# Patient Record
Sex: Female | Born: 1950
Health system: Southern US, Community
[De-identification: ages and names within clinical notes are randomized; demographics above are authoritative.]

## PROBLEM LIST (undated history)

## (undated) DIAGNOSIS — E785 Hyperlipidemia, unspecified: Secondary | ICD-10-CM

## (undated) DIAGNOSIS — H3530 Unspecified macular degeneration: Secondary | ICD-10-CM

## (undated) DIAGNOSIS — I10 Essential (primary) hypertension: Secondary | ICD-10-CM

## (undated) DIAGNOSIS — E119 Type 2 diabetes mellitus without complications: Secondary | ICD-10-CM

## (undated) DIAGNOSIS — K589 Irritable bowel syndrome without diarrhea: Secondary | ICD-10-CM

## (undated) DIAGNOSIS — H401130 Primary open-angle glaucoma, bilateral, stage unspecified: Secondary | ICD-10-CM

## (undated) HISTORY — DX: Type 2 diabetes mellitus without complications: E11.9

## (undated) HISTORY — PX: ROTATOR CUFF REPAIR: SHX139

## (undated) HISTORY — DX: Unspecified macular degeneration: H35.30

## (undated) HISTORY — DX: Hyperlipidemia, unspecified: E78.5

## (undated) HISTORY — DX: Irritable bowel syndrome, unspecified: K58.9

## (undated) HISTORY — DX: Essential (primary) hypertension: I10

## (undated) HISTORY — DX: Primary open-angle glaucoma, bilateral, stage unspecified: H40.1130

## (undated) HISTORY — PX: TUBAL LIGATION: SHX77

---

## 1998-04-27 ENCOUNTER — Emergency Department (HOSPITAL_COMMUNITY): Admission: EM | Admit: 1998-04-27 | Discharge: 1998-04-27 | Payer: Self-pay | Admitting: Emergency Medicine

## 1999-06-01 ENCOUNTER — Other Ambulatory Visit: Admission: RE | Admit: 1999-06-01 | Discharge: 1999-06-01 | Payer: Self-pay | Admitting: Obstetrics and Gynecology

## 1999-08-31 ENCOUNTER — Other Ambulatory Visit: Admission: RE | Admit: 1999-08-31 | Discharge: 1999-08-31 | Payer: Self-pay | Admitting: Obstetrics and Gynecology

## 2001-02-21 ENCOUNTER — Other Ambulatory Visit: Admission: RE | Admit: 2001-02-21 | Discharge: 2001-02-21 | Payer: Self-pay | Admitting: Obstetrics and Gynecology

## 2002-07-30 ENCOUNTER — Other Ambulatory Visit: Admission: RE | Admit: 2002-07-30 | Discharge: 2002-07-30 | Payer: Self-pay | Admitting: Obstetrics and Gynecology

## 2004-10-29 ENCOUNTER — Emergency Department (HOSPITAL_COMMUNITY): Admission: EM | Admit: 2004-10-29 | Discharge: 2004-10-29 | Payer: Self-pay | Admitting: Family Medicine

## 2013-05-20 ENCOUNTER — Encounter (HOSPITAL_COMMUNITY): Payer: Self-pay

## 2013-05-20 ENCOUNTER — Emergency Department (HOSPITAL_COMMUNITY)
Admission: EM | Admit: 2013-05-20 | Discharge: 2013-05-20 | Disposition: A | Discharge status: Home / Self Care | Payer: PRIVATE HEALTH INSURANCE | Attending: Emergency Medicine | Admitting: Emergency Medicine

## 2013-05-20 DIAGNOSIS — R197 Diarrhea, unspecified: Secondary | ICD-10-CM | POA: Insufficient documentation

## 2013-05-20 DIAGNOSIS — K2970 Gastritis, unspecified, without bleeding: Secondary | ICD-10-CM | POA: Insufficient documentation

## 2013-05-20 DIAGNOSIS — Z87891 Personal history of nicotine dependence: Secondary | ICD-10-CM | POA: Insufficient documentation

## 2013-05-20 DIAGNOSIS — R42 Dizziness and giddiness: Secondary | ICD-10-CM | POA: Insufficient documentation

## 2013-05-20 DIAGNOSIS — R112 Nausea with vomiting, unspecified: Secondary | ICD-10-CM | POA: Insufficient documentation

## 2013-05-20 LAB — URINALYSIS, ROUTINE W REFLEX MICROSCOPIC
Bilirubin Urine: NEGATIVE
Glucose, UA: NEGATIVE mg/dL
Hgb urine dipstick: NEGATIVE
Ketones, ur: NEGATIVE mg/dL
Leukocytes, UA: NEGATIVE
Nitrite: NEGATIVE
Protein, ur: NEGATIVE mg/dL
Specific Gravity, Urine: 1.014 (ref 1.005–1.030)
Urobilinogen, UA: 0.2 mg/dL (ref 0.0–1.0)
pH: 7 (ref 5.0–8.0)

## 2013-05-20 LAB — COMPREHENSIVE METABOLIC PANEL
ALT: 14 U/L (ref 0–35)
AST: 20 U/L (ref 0–37)
Albumin: 3.4 g/dL — ABNORMAL LOW (ref 3.5–5.2)
Alkaline Phosphatase: 84 U/L (ref 39–117)
BUN: 13 mg/dL (ref 6–23)
CO2: 27 mEq/L (ref 19–32)
Calcium: 8.9 mg/dL (ref 8.4–10.5)
Chloride: 104 mEq/L (ref 96–112)
Creatinine, Ser: 0.66 mg/dL (ref 0.50–1.10)
GFR calc Af Amer: >90 mL/min (ref >90)
GFR calc non Af Amer: >90 mL/min (ref >90)
Glucose, Bld: 113 mg/dL — ABNORMAL HIGH (ref 70–99)
Potassium: 4.4 mEq/L (ref 3.5–5.1)
Sodium: 139 mEq/L (ref 135–145)
Total Bilirubin: 0.2 mg/dL — ABNORMAL LOW (ref 0.3–1.2)
Total Protein: 7.5 g/dL (ref 6.0–8.3)

## 2013-05-20 LAB — CBC WITH DIFFERENTIAL/PLATELET
Basophils Absolute: 0 10*3/uL (ref 0.0–0.1)
Basophils Relative: 1 % (ref 0–1)
Eosinophils Absolute: 0.1 10*3/uL (ref 0.0–0.7)
Eosinophils Relative: 1 % (ref 0–5)
HCT: 35.2 % — ABNORMAL LOW (ref 36.0–46.0)
Hemoglobin: 12 g/dL (ref 12.0–15.0)
Lymphocytes Relative: 26 % (ref 12–46)
Lymphs Abs: 2 10*3/uL (ref 0.7–4.0)
MCH: 27.8 pg (ref 26.0–34.0)
MCHC: 34.1 g/dL (ref 30.0–36.0)
MCV: 81.7 fL (ref 78.0–100.0)
Monocytes Absolute: 0.8 10*3/uL (ref 0.1–1.0)
Monocytes Relative: 10 % (ref 3–12)
Neutro Abs: 5 10*3/uL (ref 1.7–7.7)
Neutrophils Relative %: 63 % (ref 43–77)
Platelets: 200 10*3/uL (ref 150–400)
RBC: 4.31 MIL/uL (ref 3.87–5.11)
RDW: 14.1 % (ref 11.5–15.5)
WBC: 7.9 10*3/uL (ref 4.0–10.5)

## 2013-05-20 LAB — LIPASE, BLOOD: Lipase: 22 U/L (ref 11–59)

## 2013-05-20 LAB — LACTIC ACID, PLASMA: Lactic Acid, Venous: 1.5 mmol/L (ref 0.5–2.2)

## 2013-05-20 MED ORDER — ONDANSETRON HCL 4 MG/2ML IJ SOLN: 4.0000 mg | Freq: Once | INTRAMUSCULAR | Status: DC

## 2013-05-20 MED ORDER — ONDANSETRON HCL 4 MG PO TABS: 4.0000 mg | ORAL_TABLET | Freq: Four times a day (QID) | ORAL | Status: DC

## 2013-05-20 MED ORDER — ONDANSETRON HCL 4 MG/2ML IJ SOLN
4.0000 mg | Freq: Once | INTRAMUSCULAR | Status: DC
  Filled 2013-05-20: qty 2

## 2013-05-20 MED ORDER — SODIUM CHLORIDE 0.9 % IV SOLN
1000.0000 mL | Freq: Once | INTRAVENOUS | Status: AC
  Administered 2013-05-20: 1000 mL via INTRAVENOUS

## 2013-05-20 NOTE — ED Notes (Signed)
Pt ambulated without difficulty

## 2013-05-20 NOTE — ED Provider Notes (Signed)
CSN: [id number]     Arrival date & time 05/20/13  0508 History   First MD Initiated Contact with Patient 05/20/13 [phone]     Chief Complaint  Patient presents with  . Nausea  . Emesis    unknown amounts more than 10  . Diarrhea    unknown amounts more than 10   (Consider location/radiation/quality/duration/timing/severity/associated sxs/prior Treatment) The history is provided by the patient. No language interpreter was used.  Bianca Foster is a 62 y/o F presenting to the ED, BIB EMS, with nausea, emesis, and diarrhea that started at approximately 3:00AM this morning. Patient reported that this was a sudden onset. Patient reported that while she was sleeping she woke up due to dizziness. Reported that she was continuously vomiting, reported at least more than 10 times. Patient reported that when vomiting she had episodes of diarrhea simultaneously. Reported that she ate a grilled burger at 9:00PM the night before. Denied falling, fainting, abdominal pain, chest pain, shortness of breath, difficulty breathing, blurred vision, sudden loss of vision, back pain, neck pain, chills, tremors, urinary symptoms. PCP none   History reviewed. No pertinent past medical history. History reviewed. No pertinent past surgical history. No family history on file. History  Substance Use Topics  . Smoking status: Former Smoker    Types: Cigarettes    Quit date: 05/20/2009  . Smokeless tobacco: Never Used  . Alcohol Use: 0.6 oz/week    1 Glasses of wine per week   OB History   Grav Para Term Preterm Abortions TAB SAB Ect Mult Living                 Review of Systems  Constitutional: Negative for fever and chills.  HENT: Negative for trouble swallowing and neck pain.   Eyes: Negative for visual disturbance.  Respiratory: Negative for chest tightness and shortness of breath.   Cardiovascular: Negative for chest pain.  Gastrointestinal: Positive for nausea, vomiting and diarrhea. Negative for abdominal  pain, constipation, blood in stool and anal bleeding.  Musculoskeletal: Negative for back pain.  Neurological: Positive for dizziness. Negative for numbness.  All other systems reviewed and are negative.    Allergies  Codeine  Home Medications   Current Outpatient Rx  Name  Route  Sig  Dispense  Refill  . cetirizine (ZYRTEC) 10 MG tablet   Oral   Take 10 mg by mouth daily as needed for allergies.         Marland Kitchen ondansetron (ZOFRAN) 4 MG tablet   Oral   Take 1 tablet (4 mg total) by mouth every 6 (six) hours.   12 tablet   0    BP 159/92  Pulse 79  Temp(Src) 97.6 F (36.4 C) (Oral)  Resp 18  SpO2 100% Physical Exam  Nursing note and vitals reviewed. Constitutional: She is oriented to person, place, and time. She appears well-developed and well-nourished. No distress.  HENT:  Head: Normocephalic and atraumatic.  Mouth/Throat: Oropharynx is clear and moist. No oropharyngeal exudate.  Negative posterior oropharynx swelling. Negative erythema, exudate, tonsilar adenopathy.  Eyes: Conjunctivae and EOM are normal. Pupils are equal, round, and reactive to light. Right eye exhibits no discharge. Left eye exhibits no discharge.  Negative nystagmus  Neck: Normal range of motion. Neck supple. No tracheal deviation present.  Negative neck stiffness Negative nuchal rigidity Negative pain upon palpation to the cervical spine   Cardiovascular: Normal rate, regular rhythm and normal heart sounds.  Exam reveals no friction rub.  No murmur heard. Pulses:      Radial pulses are 2+ on the right side, and 2+ on the left side.       Dorsalis pedis pulses are 2+ on the right side, and 2+ on the left side.  Pulmonary/Chest: Effort normal and breath sounds normal. No respiratory distress. She has no wheezes. She has no rales.  Abdominal: Soft. Bowel sounds are normal. She exhibits no distension. There is no tenderness. There is no rebound and no guarding.  Negative Murphy's sign Negative  McBurney's point  Musculoskeletal: Normal range of motion.  Lymphadenopathy:    She has no cervical adenopathy.  Neurological: She is alert and oriented to person, place, and time. No cranial nerve deficit. She exhibits normal muscle tone. Coordination normal.  Cranial nerves III-XII grossly intact   Skin: Skin is warm and dry. No rash noted. She is not diaphoretic. No erythema.  Psychiatric: She has a normal mood and affect. Her behavior is normal. Thought content normal.    ED Course  Procedures (including critical care time) Labs Review Labs Reviewed  COMPREHENSIVE METABOLIC PANEL - Abnormal; Notable for the following:    Glucose, Bld 113 (*)    Albumin 3.4 (*)    Total Bilirubin 0.2 (*)    All other components within normal limits  CBC WITH DIFFERENTIAL - Abnormal; Notable for the following:    HCT 35.2 (*)    All other components within normal limits  URINALYSIS, ROUTINE W REFLEX MICROSCOPIC  LIPASE, BLOOD  LACTIC ACID, PLASMA   Imaging Review No results found.  MDM   1. Gastritis   2. Nausea and vomiting     Patient presenting to the ED with sudden onset of nausea, vomiting, and diarrhea this morning with associated dizziness. Patient reported that she went out to eat last night and ate a burger at a restaurant at approximately 9:00PM. Alert and oriented. Negative acute abdomen, negative peritoneal signs. BS normoactive in all 4 quadrants. Soft, non-tender.  Lactic acid negative elevation. Lipase negative elevation. CMP negative findings. CBC negative findings. Urine negative for infection. Patient placed on IV fluids and Zofran administered. Upon assessment patient reported the nausea and dizziness have subsided. Patient tolerated liquid by mouth. No episodes of emesis while in the emergency department setting. Patient ambulated with-negative sway. Denied dizziness, headaches. Suspicion to be due to food, possible food poisoning. Patient reported that she feels a lot  better. Patient stable, afebrile. Discharged patient. Discharged patient with Zofran for nausea control. Discussed with patient bright-eyed for diarrhea control. Discussed with patient to rest and stay hydrated. Discussed the patient to [facility] to be reevaluated next week. Discussed with patient to continue monitor symptoms if symptoms are to worsen or change report back to emergency department-strict return instructions given. Patient agreed to plan of care, understood, all questions answered.  Raymon Mutton, PA-C 05/20/13 1658

## 2013-05-20 NOTE — ED Notes (Signed)
Brought in by [employer], patient ate [place] at a outdoor [employer], had hamburger and salad, EMS gave 4mg  zofran in transit, Nausea accompanied with vomiting and diarrhea at the same time, per son over 10 times, no blood in vomit or stool

## 2013-05-20 NOTE — ED Notes (Signed)
Night shift RN reports he gave pt her 0545 dose of Zofran at 0700.

## 2013-05-20 NOTE — Progress Notes (Signed)
Met patient at bedside.Role of case manager explained.Patient verbalizes understanding.Patient reports she does not have a PCP.Resource sheet provided for the [facility].Patient will call and make a PCP appointment next week.

## 2013-05-21 NOTE — ED Provider Notes (Signed)
Medical screening examination/treatment/procedure(s) were performed by non-physician practitioner and as supervising physician I was immediately available for consultation/collaboration.  Martrice Apt, MD 05/21/13 0440 

## 2013-05-29 ENCOUNTER — Encounter: Payer: Self-pay | Admitting: Internal Medicine

## 2013-05-29 ENCOUNTER — Ambulatory Visit: Payer: PRIVATE HEALTH INSURANCE | Attending: Internal Medicine | Admitting: Internal Medicine

## 2013-05-29 VITALS — BP 174/112 | HR 100 | Temp 97.4°F | Resp 16 | Ht 62.6 in | Wt 160.0 lb

## 2013-05-29 DIAGNOSIS — I10 Essential (primary) hypertension: Secondary | ICD-10-CM | POA: Insufficient documentation

## 2013-05-29 NOTE — Progress Notes (Signed)
Pt is here today to establish care. HFU   Last Saturday pt got food poisoning and received care at the hospital. She still feels weak and is weary of eating but reports that's she's healthy and doing much better.

## 2013-05-29 NOTE — Patient Instructions (Signed)
Hypertension  As your heart beats, it forces blood through your arteries. This force is your blood pressure. If the pressure is too high, it is called hypertension (HTN) or high blood pressure. HTN is dangerous because you may have it and not know it. High blood pressure may mean that your heart has to work harder to pump blood. Your arteries may be narrow or stiff. The extra work puts you at risk for heart disease, stroke, and other problems.   Blood pressure consists of two numbers, a higher number over a lower, 110/72, for example. It is stated as "110 over 72." The ideal is below 120 for the top number (systolic) and under 80 for the bottom (diastolic). Write down your blood pressure today.  You should pay close attention to your blood pressure if you have certain conditions such as:   Heart failure.   Prior heart attack.   Diabetes   Chronic kidney disease.   Prior stroke.   Multiple risk factors for heart disease.  To see if you have HTN, your blood pressure should be measured while you are seated with your arm held at the level of the heart. It should be measured at least twice. A one-time elevated blood pressure reading (especially in the Emergency Department) does not mean that you need treatment. There may be conditions in which the blood pressure is different between your right and left arms. It is important to see your caregiver soon for a recheck.  Most people have essential hypertension which means that there is not a specific cause. This type of high blood pressure may be lowered by changing lifestyle factors such as:   Stress.   Smoking.   Lack of exercise.   Excessive weight.   Drug/tobacco/alcohol use.   Eating less salt.  Most people do not have symptoms from high blood pressure until it has caused damage to the body. Effective treatment can often prevent, delay or reduce that damage.  TREATMENT    When a cause has been identified, treatment for high blood pressure is directed at the cause. There are a large number of medications to treat HTN. These fall into several categories, and your caregiver will help you select the medicines that are best for you. Medications may have side effects. You should review side effects with your caregiver.  If your blood pressure stays high after you have made lifestyle changes or started on medicines,    Your medication(s) may need to be changed.   Other problems may need to be addressed.   Be certain you understand your prescriptions, and know how and when to take your medicine.   Be sure to follow up with your caregiver within the time frame advised (usually within two weeks) to have your blood pressure rechecked and to review your medications.   If you are taking more than one medicine to lower your blood pressure, make sure you know how and at what times they should be taken. Taking two medicines at the same time can result in blood pressure that is too low.  SEEK IMMEDIATE MEDICAL CARE IF:   You develop a severe headache, blurred or changing vision, or confusion.   You have unusual weakness or numbness, or a faint feeling.   You have severe chest or abdominal pain, vomiting, or breathing problems.  MAKE SURE YOU:    Understand these instructions.   Will watch your condition.   Will get help right away if you are not doing well   or get worse.  Document Released: 09/06/2005 Document Revised: 11/29/2011 Document Reviewed: 04/26/2008  ExitCare Patient Information 2014 [employer]  DASH Diet   The DASH diet stands for "Dietary Approaches to Stop Hypertension." It is a healthy eating plan that has been shown to reduce high blood pressure (hypertension) in as little as 14 days, while also possibly providing other significant health benefits. These other health benefits include reducing the risk of breast cancer after menopause and reducing the risk of type 2 diabetes, heart disease, colon cancer, and stroke. Health benefits also include weight loss and slowing kidney failure in patients with chronic kidney disease.   DIET GUIDELINES   Limit salt (sodium). Your diet should contain less than 1500 mg of sodium daily.   Limit refined or processed carbohydrates. Your diet should include mostly whole grains. Desserts and added sugars should be used sparingly.   Include small amounts of heart-healthy fats. These types of fats include nuts, oils, and tub margarine. Limit saturated and trans fats. These fats have been shown to be harmful in the body.  CHOOSING FOODS   The following food groups are based on a 2000 calorie diet. See your Registered Dietitian for individual calorie needs.  Grains and Grain Products (6 to 8 servings daily)   Eat More Often: Whole-wheat bread, brown rice, whole-grain or wheat pasta, quinoa, popcorn without added fat or salt (air popped).   Eat Less Often: White bread, white pasta, white rice, cornbread.  Vegetables (4 to 5 servings daily)   Eat More Often: Fresh, frozen, and canned vegetables. Vegetables may be raw, steamed, roasted, or grilled with a minimal amount of fat.   Eat Less Often/Avoid: Creamed or fried vegetables. Vegetables in a cheese sauce.  Fruit (4 to 5 servings daily)   Eat More Often: All fresh, canned (in natural juice), or frozen fruits. Dried fruits without added sugar. One hundred percent fruit juice ( cup [237 mL] daily).   Eat Less Often: Dried fruits with added sugar. Canned fruit in light or heavy syrup.   Lean Meats, Fish, and Poultry (2 servings or less daily. One serving is 3 to 4 oz [85-114 g]).   Eat More Often: Ninety percent or leaner ground beef, tenderloin, sirloin. Round cuts of beef, chicken breast, turkey breast. All fish. Grill, bake, or broil your meat. Nothing should be fried.   Eat Less Often/Avoid: Fatty cuts of meat, turkey, or chicken leg, thigh, or wing. Fried cuts of meat or fish.  Dairy (2 to 3 servings)   Eat More Often: Low-fat or fat-free milk, low-fat plain or light yogurt, reduced-fat or part-skim cheese.   Eat Less Often/Avoid: Milk (whole, 2%).Whole milk yogurt. Full-fat cheeses.  Nuts, Seeds, and Legumes (4 to 5 servings per week)   Eat More Often: All without added salt.   Eat Less Often/Avoid: Salted nuts and seeds, canned beans with added salt.  Fats and Sweets (limited)   Eat More Often: Vegetable oils, tub margarines without trans fats, sugar-free gelatin. Mayonnaise and salad dressings.   Eat Less Often/Avoid: Coconut oils, palm oils, butter, stick margarine, cream, half and half, cookies, candy, pie.  FOR MORE INFORMATION  The Dash Diet Eating Plan: www.dashdiet.org  Document Released: 08/26/2011 Document Revised: 11/29/2011 Document Reviewed: 08/26/2011  ExitCare Patient Information 2014 [employer].

## 2013-05-29 NOTE — Progress Notes (Signed)
Patient ID: Bianca Foster, female   DOB: Jul 07, 1951, 62 y.o.   MRN: [id number] Patient Demographics  Bianca Foster, is a 62 y.o. female  WUJ:[id number]  [id number]  DOB - 11-Sep-1951  CC:  Chief Complaint  Patient presents with  . Establish Care       HPI: Bianca Foster today is here to establish medical care. She is a 62 years old pleasant woman with no significant past medical history, not on any chronic medication. She was recently seen in the urgent care for food poisoning. She has since recovered well was told to followup with her primary care physician. She denies any diarrhea nausea or vomiting at this time. Patient has No headache, No chest pain, No abdominal pain - No Nausea, No new weakness tingling or numbness, No Cough - SOB. She quit smoking about 5 years ago having smoked for almost 40 years. She does not drink alcohol., 5 days ago she started taking Benadryl over-the-counter for possible allergy. No other medication history on chronic basis. She is allergic to codeine  Allergies  Allergen Reactions  . Codeine Nausea And Vomiting   History reviewed. No pertinent past medical history. Current Outpatient Prescriptions on File Prior to Visit  Medication Sig Dispense Refill  . cetirizine (ZYRTEC) 10 MG tablet Take 10 mg by mouth daily as needed for allergies.      Marland Kitchen ondansetron (ZOFRAN) 4 MG tablet Take 1 tablet (4 mg total) by mouth every 6 (six) hours.  12 tablet  0   No current facility-administered medications on file prior to visit.   History reviewed. No pertinent family history. History   Social History  . Marital Status: Divorced    Spouse Name: N/A    Number of Children: N/A  . Years of Education: N/A   Occupational History  . Not on file.   Social History Main Topics  . Smoking status: Former Smoker    Types: Cigarettes    Quit date: 05/20/2009  . Smokeless tobacco: Never Used  . Alcohol Use: 0.6 oz/week    1 Glasses of wine per week  . Drug Use:  No  . Sexual Activity: Not on file   Other Topics Concern  . Not on file   Social History Narrative  . No narrative on file    Review of Systems: Constitutional: Negative for fever, chills, diaphoresis, activity change, appetite change and fatigue. HENT: Negative for ear pain, nosebleeds, congestion, facial swelling, rhinorrhea, neck pain, neck stiffness and ear discharge.  Eyes: Negative for pain, discharge, redness, itching and visual disturbance. Respiratory: Negative for cough, choking, chest tightness, shortness of breath, wheezing and stridor.  Cardiovascular: Negative for chest pain, palpitations and leg swelling. Gastrointestinal: Negative for abdominal distention. Genitourinary: Negative for dysuria, urgency, frequency, hematuria, flank pain, decreased urine volume, difficulty urinating and dyspareunia.  Musculoskeletal: Negative for back pain, joint swelling, arthralgia and gait problem. Neurological: Negative for dizziness, tremors, seizures, syncope, facial asymmetry, speech difficulty, weakness, light-headedness, numbness and headaches.  Hematological: Negative for adenopathy. Does not bruise/bleed easily. Psychiatric/Behavioral: Negative for hallucinations, behavioral problems, confusion, dysphoric mood, decreased concentration and agitation.    Objective:   Filed Vitals:   05/29/13 1515  BP: 174/112  Pulse: 100  Temp: 97.4 F (36.3 C)  Resp: 16    Physical Exam: Constitutional: Patient appears well-developed and well-nourished. No distress. HENT: Normocephalic, atraumatic, External right and left ear normal. Oropharynx is clear and moist.  Eyes: Conjunctivae and EOM are normal. PERRLA, no scleral icterus. Neck:  Normal ROM. Neck supple. No JVD. No tracheal deviation. No thyromegaly. CVS: RRR, S1/S2 +, no murmurs, no gallops, no carotid bruit.  Pulmonary: Effort and breath sounds normal, no stridor, rhonchi, wheezes, rales.  Abdominal: Soft. BS +, no distension,  tenderness, rebound or guarding.  Musculoskeletal: Normal range of motion. No edema and no tenderness.  Lymphadenopathy: No lymphadenopathy noted, cervical, inguinal or axillary Neuro: Alert. Normal reflexes, muscle tone coordination. No cranial nerve deficit. Skin: Skin is warm and dry. No rash noted. Not diaphoretic. No erythema. No pallor. Psychiatric: Normal mood and affect. Behavior, judgment, thought content normal.  Lab Results  Component Value Date   WBC 7.9 05/20/2013   HGB 12.0 05/20/2013   HCT 35.2* 05/20/2013   MCV 81.7 05/20/2013   PLT 200 05/20/2013   Lab Results  Component Value Date   CREATININE 0.66 05/20/2013   BUN 13 05/20/2013   NA 139 05/20/2013   K 4.4 05/20/2013   CL 104 05/20/2013   CO2 27 05/20/2013    No results found for this basename: HGBA1C   Lipid Panel  No results found for this basename: chol, trig, hdl, cholhdl, vldl, ldlcalc       Assessment and plan:   Patient Active Problem List   Diagnosis Date Noted  . Accelerated hypertension 05/29/2013    Plan: Start lisinopril-hydrochlorothiazide 20/25 mg tablet by mouth once daily: Patient refused to start medication, she wants to do ambulatory blood pressure measurement and bring the log in one week She claims that she is not hypertensive now she's just been anxious because of this visit. She also says she has been taking over-the-counter Benadryl for about a week.   Patient was extensively counseled about blood pressure goal and the need to start medication She was encouraged to bring a log of her blood pressure readings for the next one week  She was counseled extensively on nutrition and exercise She was well educated on hypertension and its likely complications    Health Maintenance -Colonoscopy: Need one now  -Pap Smear: needs now  -Mammogram: needs now  -Vaccinations:  -TdAP  -PNA (PPSV23) (one dose after 65) (or one dose before 65 if chronic conditions)  -Zoster (1 dose after 64  yrs)  -Influenza  Follow up inone week for blood pressure check and followup  Needs to schedule appointment next one to 2 months for a general physical examination, colonoscopy, Pap smear and mammogram   The patient was given clear instructions to go to ER or return to medical center if symptoms don't improve, worsen or new problems develop. The patient verbalized understanding. The patient was told to call to get lab results if they haven't heard anything in the next week.   Jeanann Lewandowsky, MD, MHA, FACP [facility] [place], [place] [phone]   05/29/2013, 3:38 PM

## 2013-06-06 ENCOUNTER — Ambulatory Visit: Payer: PRIVATE HEALTH INSURANCE | Attending: Internal Medicine | Admitting: Internal Medicine

## 2013-06-06 ENCOUNTER — Encounter: Payer: Self-pay | Admitting: Internal Medicine

## 2013-06-06 VITALS — BP 185/120 | HR 89 | Temp 97.9°F | Resp 16

## 2013-06-06 DIAGNOSIS — I10 Essential (primary) hypertension: Secondary | ICD-10-CM

## 2013-06-06 LAB — POCT GLYCOSYLATED HEMOGLOBIN (HGB A1C): Hemoglobin A1C: 6.1

## 2013-06-06 MED ORDER — METOPROLOL TARTRATE 25 MG PO TABS: 25.0000 mg | ORAL_TABLET | Freq: Two times a day (BID) | ORAL | Status: DC

## 2013-06-06 MED ORDER — AMLODIPINE BESYLATE 10 MG PO TABS: 10.0000 mg | ORAL_TABLET | Freq: Every day | ORAL | Status: AC

## 2013-06-06 MED ORDER — AMLODIPINE BESYLATE 10 MG PO TABS: 10.0000 mg | ORAL_TABLET | Freq: Every day | ORAL | Status: DC

## 2013-06-06 MED ORDER — ESCITALOPRAM OXALATE 10 MG PO TABS: 10.0000 mg | ORAL_TABLET | Freq: Every day | ORAL | Status: DC

## 2013-06-06 NOTE — Progress Notes (Unsigned)
Pt is here for a F/U visit. Pt is here to F/U on her high blood pressure.

## 2013-06-06 NOTE — Progress Notes (Unsigned)
Patient ID: Bianca Foster, female   DOB: 11-28-1950, 62 y.o.   MRN: [id number]  Patient Demographics  Bianca Foster, is a 62 y.o. female  [id number]  [id number]  DOB - [birth year]  Chief Complaint  Patient presents with  . Follow-up        Subjective:   Bianca Foster with History of hypertension recently diagnosed along with generalized anxiety here for followup on her blood pressure check from last visit, no subjective complaints whatsoever. She's not suicidal homicidal currently not anxious. No headache chest pain or shortness of breath.  Denies any subjective complaints except as above, no active headache, no chest abdominal pain at this time, not short of breath. No focal weakness which is new.    Objective:    Patient Active Problem List   Diagnosis Date Noted  . Accelerated hypertension 05/29/2013     Filed Vitals:   06/06/13 1437  BP: 185/120  Pulse: 89  Temp: 97.9 F (36.6 C)  TempSrc: Oral  Resp: 16     Exam   Awake Alert, Oriented X 3, No new F.N deficits, Normal affect [place].AT,PERRAL Supple Neck,No JVD, No cervical lymphadenopathy appriciated.  Symmetrical Chest wall movement, Good air movement bilaterally, CTAB RRR,No Gallops,Rubs or new Murmurs, No Parasternal Heave +ve B.Sounds, Abd Soft, Non tender, No organomegaly appriciated, No rebound - guarding or rigidity. No Cyanosis, Clubbing or edema, No new Rash or bruise       Data Review   CBC No results found for this basename: WBC, HGB, HCT, PLT, MCV, MCH, MCHC, RDW, NEUTRABS, LYMPHSABS, MONOABS, EOSABS, BASOSABS, BANDABS, BANDSABD,  in the last 168 hours  Chemistries   No results found for this basename: NA, K, CL, CO2, GLUCOSE, BUN, CREATININE, GFRCGP, CALCIUM, MG, AST, ALT, ALKPHOS, BILITOT,  in the last 168 hours ------------------------------------------------------------------------------------------------------------------ No results found for this basename: HGBA1C,  in the last 72  hours ------------------------------------------------------------------------------------------------------------------ No results found for this basename: CHOL, HDL, LDLCALC, TRIG, CHOLHDL, LDLDIRECT,  in the last 72 hours ------------------------------------------------------------------------------------------------------------------ No results found for this basename: TSH, T4TOTAL, FREET3, T3FREE, THYROIDAB,  in the last 72 hours ------------------------------------------------------------------------------------------------------------------ No results found for this basename: VITAMINB12, FOLATE, FERRITIN, TIBC, IRON, RETICCTPCT,  in the last 72 hours  Coagulation profile  No results found for this basename: INR, PROTIME,  in the last 168 hours     Prior to Admission medications   Medication Sig Start Date End Date Taking? Authorizing Provider  amLODipine (NORVASC) 10 MG tablet Take 1 tablet (10 mg total) by mouth daily. 06/06/13   Leroy Sea, MD  cetirizine (ZYRTEC) 10 MG tablet Take 10 mg by mouth daily as needed for allergies.    Historical Provider, MD  metoprolol tartrate (LOPRESSOR) 25 MG tablet Take 1 tablet (25 mg total) by mouth 2 (two) times daily. 06/06/13   Leroy Sea, MD  ondansetron (ZOFRAN) 4 MG tablet Take 1 tablet (4 mg total) by mouth every 6 (six) hours. 05/20/13   Marissa Sciacca, PA-C     Assessment & Plan    Hypertension in poor control despite diet and exercise. She will be started today on Norvasc and Lopressor, she will come back in 2 weeks for repeat blood pressure check.   Mild generalized anxiety. Not has had homicidal, this is a Ongoing problem for the last several years, we'll place her on low-dose Lexapro and monitor.    Routine health maintenance.  Screening labs. CBC, BMP- done recently noted, TSH, lipid panel, A1c ordered  Lipid  Screening - ordered  Colonoscopy - referral made    Mammogram annually in women > 40  - Mammogram,  Pap smear - referral made    Immunizations    - Flu  Refused  - TDaP given today

## 2013-06-07 LAB — LIPID PANEL
Cholesterol: 232 mg/dL — ABNORMAL HIGH (ref 0–200)
HDL: 80 mg/dL (ref >39)
LDL Cholesterol: 131 mg/dL — ABNORMAL HIGH (ref 0–99)
Total CHOL/HDL Ratio: 2.9 Ratio
Triglycerides: 105 mg/dL (ref <150)
VLDL: 21 mg/dL (ref 0–40)

## 2013-06-07 LAB — TSH: TSH: 0.53 u[IU]/mL (ref 0.350–4.500)

## 2013-06-12 ENCOUNTER — Ambulatory Visit: Payer: PRIVATE HEALTH INSURANCE | Attending: Family Medicine

## 2013-06-12 ENCOUNTER — Ambulatory Visit: Payer: PRIVATE HEALTH INSURANCE

## 2013-06-12 NOTE — Progress Notes (Unsigned)
  Subjective:    Patient ID: Bianca Foster, female    DOB: Oct 27, 1950, 62 y.o.   MRN: [id number]  HPI    Review of Systems     Objective:   Physical Exam        Assessment & Plan:  PT HERE FOR REPEAT BLOOD PRESSURE S/P HTN LAST WEEK. STATES SHE IS TAKING AMLODIPINE 10MG  AND LOPRESSOR DAILY INSTEAD OF BID. DIZZINESS NOTED WITH TAKING BID.

## 2013-06-14 NOTE — Progress Notes (Signed)
Quick Note:  Have patient come back in one to 2 weeks to discuss management of mildly elevated cholesterol ______

## 2013-06-20 ENCOUNTER — Encounter: Payer: Self-pay | Admitting: Obstetrics and Gynecology

## 2013-07-05 ENCOUNTER — Ambulatory Visit: Payer: PRIVATE HEALTH INSURANCE

## 2013-07-12 ENCOUNTER — Ambulatory Visit: Payer: PRIVATE HEALTH INSURANCE

## 2013-07-12 ENCOUNTER — Encounter: Payer: Self-pay | Admitting: Internal Medicine

## 2013-07-26 ENCOUNTER — Other Ambulatory Visit: Payer: Self-pay

## 2013-08-06 ENCOUNTER — Encounter: Payer: PRIVATE HEALTH INSURANCE | Admitting: Obstetrics and Gynecology

## 2014-06-10 ENCOUNTER — Other Ambulatory Visit: Payer: Self-pay | Admitting: Family Medicine

## 2014-06-10 ENCOUNTER — Other Ambulatory Visit (HOSPITAL_COMMUNITY)
Admission: RE | Admit: 2014-06-10 | Discharge: 2014-06-10 | Disposition: A | Discharge status: Home / Self Care | Payer: PRIVATE HEALTH INSURANCE | Source: Ambulatory Visit | Attending: Family Medicine | Admitting: Family Medicine

## 2014-06-10 DIAGNOSIS — Z1151 Encounter for screening for human papillomavirus (HPV): Secondary | ICD-10-CM | POA: Insufficient documentation

## 2014-06-10 DIAGNOSIS — Z124 Encounter for screening for malignant neoplasm of cervix: Secondary | ICD-10-CM | POA: Insufficient documentation

## 2014-06-11 LAB — CYTOLOGY - PAP

## 2014-12-09 DIAGNOSIS — E785 Hyperlipidemia, unspecified: Secondary | ICD-10-CM | POA: Diagnosis not present

## 2014-12-09 DIAGNOSIS — I10 Essential (primary) hypertension: Secondary | ICD-10-CM | POA: Diagnosis not present

## 2014-12-09 DIAGNOSIS — R739 Hyperglycemia, unspecified: Secondary | ICD-10-CM | POA: Diagnosis not present

## 2014-12-09 DIAGNOSIS — S66919A Strain of unspecified muscle, fascia and tendon at wrist and hand level, unspecified hand, initial encounter: Secondary | ICD-10-CM | POA: Diagnosis not present

## 2015-01-28 DIAGNOSIS — Z961 Presence of intraocular lens: Secondary | ICD-10-CM | POA: Diagnosis not present

## 2015-01-28 DIAGNOSIS — H4011X1 Primary open-angle glaucoma, mild stage: Secondary | ICD-10-CM | POA: Diagnosis not present

## 2015-01-28 DIAGNOSIS — H0012 Chalazion right lower eyelid: Secondary | ICD-10-CM | POA: Diagnosis not present

## 2015-06-17 DIAGNOSIS — Z0389 Encounter for observation for other suspected diseases and conditions ruled out: Secondary | ICD-10-CM | POA: Diagnosis not present

## 2015-06-17 DIAGNOSIS — E785 Hyperlipidemia, unspecified: Secondary | ICD-10-CM | POA: Diagnosis not present

## 2015-06-17 DIAGNOSIS — R739 Hyperglycemia, unspecified: Secondary | ICD-10-CM | POA: Diagnosis not present

## 2015-06-17 DIAGNOSIS — Z0000 Encounter for general adult medical examination without abnormal findings: Secondary | ICD-10-CM | POA: Diagnosis not present

## 2015-06-17 DIAGNOSIS — I10 Essential (primary) hypertension: Secondary | ICD-10-CM | POA: Diagnosis not present

## 2015-06-17 DIAGNOSIS — Z23 Encounter for immunization: Secondary | ICD-10-CM | POA: Diagnosis not present

## 2015-06-17 DIAGNOSIS — Z1211 Encounter for screening for malignant neoplasm of colon: Secondary | ICD-10-CM | POA: Diagnosis not present

## 2015-07-01 DIAGNOSIS — M1811 Unilateral primary osteoarthritis of first carpometacarpal joint, right hand: Secondary | ICD-10-CM | POA: Diagnosis not present

## 2015-07-01 DIAGNOSIS — M79644 Pain in right finger(s): Secondary | ICD-10-CM | POA: Diagnosis not present

## 2015-07-28 DIAGNOSIS — Z780 Asymptomatic menopausal state: Secondary | ICD-10-CM | POA: Diagnosis not present

## 2015-08-05 DIAGNOSIS — Z961 Presence of intraocular lens: Secondary | ICD-10-CM | POA: Diagnosis not present

## 2015-08-05 DIAGNOSIS — H00011 Hordeolum externum right upper eyelid: Secondary | ICD-10-CM | POA: Diagnosis not present

## 2015-08-05 DIAGNOSIS — H401131 Primary open-angle glaucoma, bilateral, mild stage: Secondary | ICD-10-CM | POA: Diagnosis not present

## 2015-10-22 DIAGNOSIS — Z1211 Encounter for screening for malignant neoplasm of colon: Secondary | ICD-10-CM | POA: Diagnosis not present

## 2015-12-15 DIAGNOSIS — Z0000 Encounter for general adult medical examination without abnormal findings: Secondary | ICD-10-CM | POA: Diagnosis not present

## 2015-12-15 DIAGNOSIS — E785 Hyperlipidemia, unspecified: Secondary | ICD-10-CM | POA: Diagnosis not present

## 2015-12-15 DIAGNOSIS — R739 Hyperglycemia, unspecified: Secondary | ICD-10-CM | POA: Diagnosis not present

## 2015-12-15 DIAGNOSIS — I10 Essential (primary) hypertension: Secondary | ICD-10-CM | POA: Diagnosis not present

## 2016-08-24 DIAGNOSIS — H401131 Primary open-angle glaucoma, bilateral, mild stage: Secondary | ICD-10-CM | POA: Diagnosis not present

## 2016-08-24 DIAGNOSIS — Z961 Presence of intraocular lens: Secondary | ICD-10-CM | POA: Diagnosis not present

## 2016-12-28 DIAGNOSIS — Z23 Encounter for immunization: Secondary | ICD-10-CM | POA: Diagnosis not present

## 2016-12-28 DIAGNOSIS — R739 Hyperglycemia, unspecified: Secondary | ICD-10-CM | POA: Diagnosis not present

## 2016-12-28 DIAGNOSIS — Z0000 Encounter for general adult medical examination without abnormal findings: Secondary | ICD-10-CM | POA: Diagnosis not present

## 2016-12-28 DIAGNOSIS — R002 Palpitations: Secondary | ICD-10-CM | POA: Diagnosis not present

## 2016-12-28 DIAGNOSIS — Z1211 Encounter for screening for malignant neoplasm of colon: Secondary | ICD-10-CM | POA: Diagnosis not present

## 2016-12-28 DIAGNOSIS — E785 Hyperlipidemia, unspecified: Secondary | ICD-10-CM | POA: Diagnosis not present

## 2016-12-28 DIAGNOSIS — I10 Essential (primary) hypertension: Secondary | ICD-10-CM | POA: Diagnosis not present

## 2016-12-28 DIAGNOSIS — E119 Type 2 diabetes mellitus without complications: Secondary | ICD-10-CM | POA: Diagnosis not present

## 2017-01-12 DIAGNOSIS — K581 Irritable bowel syndrome with constipation: Secondary | ICD-10-CM | POA: Diagnosis not present

## 2017-01-12 DIAGNOSIS — S4992XA Unspecified injury of left shoulder and upper arm, initial encounter: Secondary | ICD-10-CM | POA: Diagnosis not present

## 2017-01-18 DIAGNOSIS — K581 Irritable bowel syndrome with constipation: Secondary | ICD-10-CM | POA: Diagnosis not present

## 2017-02-09 DIAGNOSIS — M25112 Fistula, left shoulder: Secondary | ICD-10-CM | POA: Diagnosis not present

## 2017-02-09 DIAGNOSIS — M25512 Pain in left shoulder: Secondary | ICD-10-CM | POA: Diagnosis not present

## 2017-02-09 DIAGNOSIS — S46812A Strain of other muscles, fascia and tendons at shoulder and upper arm level, left arm, initial encounter: Secondary | ICD-10-CM | POA: Diagnosis not present

## 2017-03-07 DIAGNOSIS — S46812D Strain of other muscles, fascia and tendons at shoulder and upper arm level, left arm, subsequent encounter: Secondary | ICD-10-CM | POA: Diagnosis not present

## 2017-03-07 DIAGNOSIS — G8929 Other chronic pain: Secondary | ICD-10-CM | POA: Diagnosis not present

## 2017-03-07 DIAGNOSIS — M25512 Pain in left shoulder: Secondary | ICD-10-CM | POA: Diagnosis not present

## 2017-03-07 DIAGNOSIS — M7502 Adhesive capsulitis of left shoulder: Secondary | ICD-10-CM | POA: Diagnosis not present

## 2017-03-15 ENCOUNTER — Ambulatory Visit: Payer: Medicare HMO | Attending: Orthopedic Surgery

## 2017-03-15 DIAGNOSIS — M7502 Adhesive capsulitis of left shoulder: Secondary | ICD-10-CM | POA: Insufficient documentation

## 2017-03-15 DIAGNOSIS — M25512 Pain in left shoulder: Secondary | ICD-10-CM | POA: Diagnosis not present

## 2017-03-15 DIAGNOSIS — R252 Cramp and spasm: Secondary | ICD-10-CM | POA: Diagnosis not present

## 2017-03-15 DIAGNOSIS — G8929 Other chronic pain: Secondary | ICD-10-CM | POA: Insufficient documentation

## 2017-03-15 DIAGNOSIS — M6281 Muscle weakness (generalized): Secondary | ICD-10-CM | POA: Diagnosis not present

## 2017-03-15 NOTE — Therapy (Addendum)
[place], [place], [address] Phone: [phone]   Fax:  [phone]  Physical Therapy Evaluation/Discharge  Patient Details  Name: Bianca Foster MRN: [id number] Date of Birth: [birth year] Referring Provider: Rada Hay  Encounter Date: 03/15/2017      PT End of Session - 03/15/17 1146    Visit Number 1   Number of Visits 16   Date for PT Re-Evaluation 05/06/17   Authorization Type [employer] MCR   PT Start Time 8676   PT Stop Time 1230   PT Time Calculation (min) 45 min   Activity Tolerance Patient tolerated treatment well;No increased pain   Behavior During Therapy [facility] for tasks assessed/performed      History reviewed. No pertinent past medical history.  History reviewed. No pertinent surgical history.  There were no vitals filed for this visit.       Subjective Assessment - 03/15/17 1148    Subjective She reports  a person fell backward onto LT shoulder off some steps.              [facility] PT Assessment - 03/15/17 0001      Assessment   Medical Diagnosis left shoulder adhesive capsulitis   Referring Provider Lennette Bihari Supple,MD   Onset Date/Surgical Date 01/03/17   Next MD Visit 3-4 weeks   Prior Therapy no     Precautions   Precautions None     Restrictions   Weight Bearing Restrictions No     Balance Screen   Has the patient fallen in the past 6 months No     Prior Function   Level of Independence Independent   Vocation Self employed   Vocation Requirements hair dresser      Cognition   Overall Cognitive Status Within Functional Limits for tasks assessed     ROM / Strength   AROM / PROM / Strength AROM;PROM;Strength     AROM   AROM Assessment Site Shoulder   Right/Left Shoulder Right;Left   Right Shoulder Extension 45 Degrees   Right Shoulder Flexion 160 Degrees   Right Shoulder ABduction 175 Degrees   Right Shoulder Internal Rotation 45 Degrees   Right Shoulder  External Rotation 103 Degrees   Right Shoulder Horizontal ABduction 17 Degrees   Right Shoulder Horizontal  ADduction 125 Degrees   Left Shoulder Extension 34 Degrees   Left Shoulder Flexion 84 Degrees   Left Shoulder ABduction 83 Degrees   Left Shoulder Internal Rotation 20 Degrees   Left Shoulder External Rotation 35 Degrees   Left Shoulder Horizontal ABduction 0 Degrees   Left Shoulder Horizontal ADduction 95 Degrees     PROM   PROM Assessment Site Shoulder   Right/Left Shoulder Left   Left Shoulder Extension 50 Degrees   Left Shoulder Flexion 165 Degrees   Left Shoulder ABduction 175 Degrees   Left Shoulder Internal Rotation 45 Degrees   Left Shoulder External Rotation 93 Degrees   Left Shoulder Horizontal ABduction 12 Degrees   Left Shoulder Horizontal ADduction 120 Degrees     Strength   Overall Strength Comments WNL except Lt shoulder ER/ FLEX /ABDUCT  4/5 all with pain.    Strength Assessment Site Shoulder     Palpation   Palpation comment Tender anterior LT shoulder to biceps muscle            Objective measurements completed on examination: See above findings.  PT Education - April 04, 2017 1146    Education provided Yes   Education Details POC  HEP   Person(s) Educated Patient   Methods Explanation;Demonstration;Verbal cues;Handout;Tactile cues   Comprehension Verbalized understanding;Returned demonstration          PT Short Term Goals - 2017-04-04 1232      PT SHORT TERM GOAL #1   Title She will be independnet with inital HEP   Baseline 3   Time 3   Period Weeks   Status New     PT SHORT TERM GOAL #2   Title She will have full active LT shoulder ROM   Time 4   Period Weeks   Status New     PT SHORT TERM GOAL #3   Title She will decr pain by 50% with reaching with LT arm.    Time 4   Period Weeks   Status New           PT Long Term Goals - Apr 04, 2017 1233      PT LONG TERM GOAL #1   Title She will be  independent with all HEP issued   Time 8   Period Weeks   Status New     PT LONG TERM GOAL #2   Title She will improve LT shoulder strength to 4+/5 due to decr pain.   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 ROM limited actively due to pain and PROM WNL with pain.  she also demo weakness in RT rotator cuff   Clinical Presentation Stable   Clinical Decision Making Low   Rehab Potential Good   PT Frequency 2x / week   PT Duration 8 weeks   PT Treatment/Interventions Cryotherapy;Iontophoresis 20m/ml Dexamethasone;Ultrasound;Moist Heat;Passive range of motion;Patient/family education;Therapeutic exercise;Manual techniques;Therapeutic activities   PT Next Visit Plan REview stetching and start on isometrics. We could not offer appontments when she needed them so she may go to Dr SAugustin CoupePT office   PT Home Exercise Plan stretching overhead and behind back   Consulted and Agree with Plan of Care Patient      Patient will benefit from skilled therapeutic intervention in order to improve the following deficits and impairments:  Pain, Decreased activity tolerance, Increased muscle spasms, Decreased range of motion, Decreased strength  Visit Diagnosis: Adhesive capsulitis of left shoulder  Chronic left shoulder pain  Muscle weakness (generalized)  Cramp and spasm      G-Codes - [id number]    Functional Assessment Tool Used (Outpatient Only) 52% limited  FOTO   Functional Limitation Carrying, moving and handling objects   Carrying, Moving and Handling Objects Current Status (([id number] At least 40 percent  but less than 60 percent impaired, limited or restricted   Carrying, Moving and Handling Objects Goal Status [phone]) At least 20 percent but less than 40 percent impaired, limited or restricted       Problem List Patient Active Problem List   Diagnosis Date Noted  . Accelerated hypertension 05/29/2013    Darrel Hoover  PT 03/15/2017, 12:37 PM  [facility] [address] [place], [place], [address] Phone: [phone]   Fax:  [phone]  Name: LACHLAN PELTO MRN: [id number] Date of Birth: Apr 17, 1951 PHYSICAL THERAPY DISCHARGE SUMMARY  Visits from Start of Care: Eval only  Current functional level related to goals / functional outcomes: She opted to go to Dr Onnie Graham s office as we were not able to get her appointments that were for her conveince   Remaining deficits: See above  Education / Equipment: NA Plan: Patient agrees to discharge.  Patient goals were not met. Patient is being discharged due to the patient's request.  ?????    Noralee Stain PT    03/29/17    2:07

## 2017-03-15 NOTE — Patient Instructions (Signed)
Issued stretching overhead and behind ack 2-3x/day, 2-3 reps , 5-10 sec hold

## 2017-03-22 ENCOUNTER — Ambulatory Visit: Payer: Medicare HMO

## 2017-03-24 ENCOUNTER — Ambulatory Visit: Payer: Medicare HMO

## 2017-03-24 DIAGNOSIS — M7502 Adhesive capsulitis of left shoulder: Secondary | ICD-10-CM | POA: Diagnosis not present

## 2017-03-28 DIAGNOSIS — M7502 Adhesive capsulitis of left shoulder: Secondary | ICD-10-CM | POA: Diagnosis not present

## 2017-03-30 DIAGNOSIS — M7502 Adhesive capsulitis of left shoulder: Secondary | ICD-10-CM | POA: Diagnosis not present

## 2017-04-04 DIAGNOSIS — G8929 Other chronic pain: Secondary | ICD-10-CM | POA: Diagnosis not present

## 2017-04-04 DIAGNOSIS — S46812D Strain of other muscles, fascia and tendons at shoulder and upper arm level, left arm, subsequent encounter: Secondary | ICD-10-CM | POA: Diagnosis not present

## 2017-04-04 DIAGNOSIS — M25512 Pain in left shoulder: Secondary | ICD-10-CM | POA: Diagnosis not present

## 2017-04-04 DIAGNOSIS — M7502 Adhesive capsulitis of left shoulder: Secondary | ICD-10-CM | POA: Diagnosis not present

## 2017-04-18 DIAGNOSIS — G8929 Other chronic pain: Secondary | ICD-10-CM | POA: Diagnosis not present

## 2017-04-18 DIAGNOSIS — M25512 Pain in left shoulder: Secondary | ICD-10-CM | POA: Diagnosis not present

## 2017-04-20 DIAGNOSIS — S46012D Strain of muscle(s) and tendon(s) of the rotator cuff of left shoulder, subsequent encounter: Secondary | ICD-10-CM | POA: Diagnosis not present

## 2017-05-20 DIAGNOSIS — M24112 Other articular cartilage disorders, left shoulder: Secondary | ICD-10-CM | POA: Diagnosis not present

## 2017-05-20 DIAGNOSIS — M19012 Primary osteoarthritis, left shoulder: Secondary | ICD-10-CM | POA: Diagnosis not present

## 2017-05-20 DIAGNOSIS — S43432A Superior glenoid labrum lesion of left shoulder, initial encounter: Secondary | ICD-10-CM | POA: Diagnosis not present

## 2017-05-20 DIAGNOSIS — S46012A Strain of muscle(s) and tendon(s) of the rotator cuff of left shoulder, initial encounter: Secondary | ICD-10-CM | POA: Diagnosis not present

## 2017-05-20 DIAGNOSIS — S46092A Other injury of muscle(s) and tendon(s) of the rotator cuff of left shoulder, initial encounter: Secondary | ICD-10-CM | POA: Diagnosis not present

## 2017-05-20 DIAGNOSIS — G8918 Other acute postprocedural pain: Secondary | ICD-10-CM | POA: Diagnosis not present

## 2017-05-20 DIAGNOSIS — M7542 Impingement syndrome of left shoulder: Secondary | ICD-10-CM | POA: Diagnosis not present

## 2017-05-31 DIAGNOSIS — M25512 Pain in left shoulder: Secondary | ICD-10-CM | POA: Diagnosis not present

## 2017-06-27 DIAGNOSIS — M25512 Pain in left shoulder: Secondary | ICD-10-CM | POA: Diagnosis not present

## 2017-06-29 DIAGNOSIS — M25512 Pain in left shoulder: Secondary | ICD-10-CM | POA: Diagnosis not present

## 2017-07-04 DIAGNOSIS — M25512 Pain in left shoulder: Secondary | ICD-10-CM | POA: Diagnosis not present

## 2017-07-07 DIAGNOSIS — M25512 Pain in left shoulder: Secondary | ICD-10-CM | POA: Diagnosis not present

## 2017-07-11 DIAGNOSIS — M25512 Pain in left shoulder: Secondary | ICD-10-CM | POA: Diagnosis not present

## 2017-07-14 DIAGNOSIS — M25512 Pain in left shoulder: Secondary | ICD-10-CM | POA: Diagnosis not present

## 2017-07-18 DIAGNOSIS — M25512 Pain in left shoulder: Secondary | ICD-10-CM | POA: Diagnosis not present

## 2017-07-21 DIAGNOSIS — M25512 Pain in left shoulder: Secondary | ICD-10-CM | POA: Diagnosis not present

## 2017-07-25 DIAGNOSIS — E119 Type 2 diabetes mellitus without complications: Secondary | ICD-10-CM | POA: Diagnosis not present

## 2017-07-25 DIAGNOSIS — K581 Irritable bowel syndrome with constipation: Secondary | ICD-10-CM | POA: Diagnosis not present

## 2017-07-25 DIAGNOSIS — E785 Hyperlipidemia, unspecified: Secondary | ICD-10-CM | POA: Diagnosis not present

## 2017-07-25 DIAGNOSIS — I10 Essential (primary) hypertension: Secondary | ICD-10-CM | POA: Diagnosis not present

## 2017-07-26 DIAGNOSIS — M25512 Pain in left shoulder: Secondary | ICD-10-CM | POA: Diagnosis not present

## 2017-07-28 DIAGNOSIS — M25512 Pain in left shoulder: Secondary | ICD-10-CM | POA: Diagnosis not present

## 2017-08-01 DIAGNOSIS — M25512 Pain in left shoulder: Secondary | ICD-10-CM | POA: Diagnosis not present

## 2017-08-04 DIAGNOSIS — M25512 Pain in left shoulder: Secondary | ICD-10-CM | POA: Diagnosis not present

## 2017-08-08 DIAGNOSIS — M25512 Pain in left shoulder: Secondary | ICD-10-CM | POA: Diagnosis not present

## 2017-08-18 DIAGNOSIS — M25512 Pain in left shoulder: Secondary | ICD-10-CM | POA: Diagnosis not present

## 2017-08-22 DIAGNOSIS — S46012D Strain of muscle(s) and tendon(s) of the rotator cuff of left shoulder, subsequent encounter: Secondary | ICD-10-CM | POA: Diagnosis not present

## 2017-08-22 DIAGNOSIS — Z4789 Encounter for other orthopedic aftercare: Secondary | ICD-10-CM | POA: Diagnosis not present

## 2017-08-23 DIAGNOSIS — M25512 Pain in left shoulder: Secondary | ICD-10-CM | POA: Diagnosis not present

## 2017-09-01 DIAGNOSIS — M25512 Pain in left shoulder: Secondary | ICD-10-CM | POA: Diagnosis not present

## 2017-09-05 DIAGNOSIS — M25512 Pain in left shoulder: Secondary | ICD-10-CM | POA: Diagnosis not present

## 2017-09-08 DIAGNOSIS — M25512 Pain in left shoulder: Secondary | ICD-10-CM | POA: Diagnosis not present

## 2017-09-15 DIAGNOSIS — M25512 Pain in left shoulder: Secondary | ICD-10-CM | POA: Diagnosis not present

## 2017-09-29 DIAGNOSIS — M75120 Complete rotator cuff tear or rupture of unspecified shoulder, not specified as traumatic: Secondary | ICD-10-CM | POA: Insufficient documentation

## 2017-10-03 DIAGNOSIS — S46012D Strain of muscle(s) and tendon(s) of the rotator cuff of left shoulder, subsequent encounter: Secondary | ICD-10-CM | POA: Diagnosis not present

## 2017-10-03 DIAGNOSIS — Z9889 Other specified postprocedural states: Secondary | ICD-10-CM | POA: Diagnosis not present

## 2017-12-13 DIAGNOSIS — Z961 Presence of intraocular lens: Secondary | ICD-10-CM | POA: Diagnosis not present

## 2017-12-13 DIAGNOSIS — H401123 Primary open-angle glaucoma, left eye, severe stage: Secondary | ICD-10-CM | POA: Diagnosis not present

## 2017-12-13 DIAGNOSIS — H401111 Primary open-angle glaucoma, right eye, mild stage: Secondary | ICD-10-CM | POA: Diagnosis not present

## 2018-01-02 DIAGNOSIS — Z1231 Encounter for screening mammogram for malignant neoplasm of breast: Secondary | ICD-10-CM | POA: Diagnosis not present

## 2018-01-30 DIAGNOSIS — E119 Type 2 diabetes mellitus without complications: Secondary | ICD-10-CM | POA: Diagnosis not present

## 2018-01-30 DIAGNOSIS — Z0000 Encounter for general adult medical examination without abnormal findings: Secondary | ICD-10-CM | POA: Diagnosis not present

## 2018-01-30 DIAGNOSIS — Z136 Encounter for screening for cardiovascular disorders: Secondary | ICD-10-CM | POA: Diagnosis not present

## 2018-01-30 DIAGNOSIS — E785 Hyperlipidemia, unspecified: Secondary | ICD-10-CM | POA: Diagnosis not present

## 2018-01-30 DIAGNOSIS — I10 Essential (primary) hypertension: Secondary | ICD-10-CM | POA: Diagnosis not present

## 2018-01-30 DIAGNOSIS — R002 Palpitations: Secondary | ICD-10-CM | POA: Diagnosis not present

## 2018-05-10 DIAGNOSIS — I10 Essential (primary) hypertension: Secondary | ICD-10-CM | POA: Diagnosis not present

## 2018-05-10 DIAGNOSIS — L659 Nonscarring hair loss, unspecified: Secondary | ICD-10-CM | POA: Diagnosis not present

## 2018-05-30 DIAGNOSIS — K635 Polyp of colon: Secondary | ICD-10-CM | POA: Diagnosis not present

## 2018-05-30 DIAGNOSIS — D126 Benign neoplasm of colon, unspecified: Secondary | ICD-10-CM | POA: Diagnosis not present

## 2018-05-30 DIAGNOSIS — Z1211 Encounter for screening for malignant neoplasm of colon: Secondary | ICD-10-CM | POA: Diagnosis not present

## 2018-06-02 DIAGNOSIS — Z1211 Encounter for screening for malignant neoplasm of colon: Secondary | ICD-10-CM | POA: Diagnosis not present

## 2018-06-02 DIAGNOSIS — D126 Benign neoplasm of colon, unspecified: Secondary | ICD-10-CM | POA: Diagnosis not present

## 2018-06-02 DIAGNOSIS — K635 Polyp of colon: Secondary | ICD-10-CM | POA: Diagnosis not present

## 2018-07-11 DIAGNOSIS — Z961 Presence of intraocular lens: Secondary | ICD-10-CM | POA: Diagnosis not present

## 2018-07-11 DIAGNOSIS — H401131 Primary open-angle glaucoma, bilateral, mild stage: Secondary | ICD-10-CM | POA: Diagnosis not present

## 2018-07-11 DIAGNOSIS — H353132 Nonexudative age-related macular degeneration, bilateral, intermediate dry stage: Secondary | ICD-10-CM | POA: Diagnosis not present

## 2018-07-29 DIAGNOSIS — M26609 Unspecified temporomandibular joint disorder, unspecified side: Secondary | ICD-10-CM | POA: Diagnosis not present

## 2018-08-07 DIAGNOSIS — H3530 Unspecified macular degeneration: Secondary | ICD-10-CM | POA: Diagnosis not present

## 2018-08-07 DIAGNOSIS — E785 Hyperlipidemia, unspecified: Secondary | ICD-10-CM | POA: Diagnosis not present

## 2018-08-07 DIAGNOSIS — E119 Type 2 diabetes mellitus without complications: Secondary | ICD-10-CM | POA: Diagnosis not present

## 2018-08-07 DIAGNOSIS — K581 Irritable bowel syndrome with constipation: Secondary | ICD-10-CM | POA: Diagnosis not present

## 2018-08-07 DIAGNOSIS — I10 Essential (primary) hypertension: Secondary | ICD-10-CM | POA: Diagnosis not present

## 2018-08-18 DIAGNOSIS — H40003 Preglaucoma, unspecified, bilateral: Secondary | ICD-10-CM | POA: Insufficient documentation

## 2018-08-18 DIAGNOSIS — Z961 Presence of intraocular lens: Secondary | ICD-10-CM | POA: Diagnosis not present

## 2018-08-18 DIAGNOSIS — H353131 Nonexudative age-related macular degeneration, bilateral, early dry stage: Secondary | ICD-10-CM | POA: Diagnosis not present

## 2019-02-19 DIAGNOSIS — E785 Hyperlipidemia, unspecified: Secondary | ICD-10-CM | POA: Diagnosis not present

## 2019-02-19 DIAGNOSIS — H401131 Primary open-angle glaucoma, bilateral, mild stage: Secondary | ICD-10-CM | POA: Diagnosis not present

## 2019-02-19 DIAGNOSIS — E119 Type 2 diabetes mellitus without complications: Secondary | ICD-10-CM | POA: Diagnosis not present

## 2019-02-19 DIAGNOSIS — Z0000 Encounter for general adult medical examination without abnormal findings: Secondary | ICD-10-CM | POA: Diagnosis not present

## 2019-02-19 DIAGNOSIS — I10 Essential (primary) hypertension: Secondary | ICD-10-CM | POA: Diagnosis not present

## 2019-02-19 DIAGNOSIS — H3530 Unspecified macular degeneration: Secondary | ICD-10-CM | POA: Diagnosis not present

## 2019-04-24 DIAGNOSIS — H353131 Nonexudative age-related macular degeneration, bilateral, early dry stage: Secondary | ICD-10-CM | POA: Diagnosis not present

## 2019-04-24 DIAGNOSIS — H40003 Preglaucoma, unspecified, bilateral: Secondary | ICD-10-CM | POA: Diagnosis not present

## 2019-04-24 DIAGNOSIS — Z961 Presence of intraocular lens: Secondary | ICD-10-CM | POA: Diagnosis not present

## 2019-11-05 ENCOUNTER — Ambulatory Visit: Payer: PRIVATE HEALTH INSURANCE | Attending: Internal Medicine

## 2019-11-05 DIAGNOSIS — Z23 Encounter for immunization: Secondary | ICD-10-CM | POA: Insufficient documentation

## 2019-11-05 NOTE — Progress Notes (Signed)
   Covid-19 Vaccination Clinic  Name:  Bianca Foster    MRN: [id number] DOB: 01-07-1951  11/05/2019  Bianca Foster was observed post Covid-19 immunization for 15 minutes without incidence. She was provided with Vaccine Information Sheet and instruction to access the V-Safe system.   Bianca Foster was instructed to call 911 with any severe reactions post vaccine: Marland Kitchen Difficulty breathing  . Swelling of your face and throat  . A fast heartbeat  . A bad rash all over your body  . Dizziness and weakness    Immunizations Administered    Name Date Dose VIS Date Route   Pfizer COVID-19 Vaccine 11/05/2019  3:30 PM 0.3 mL 08/31/2019 Intramuscular   Manufacturer: [place]   Lot: [id number]   [place]: [id number]

## 2019-11-19 ENCOUNTER — Ambulatory Visit: Payer: PRIVATE HEALTH INSURANCE | Attending: Internal Medicine

## 2019-11-27 ENCOUNTER — Ambulatory Visit: Payer: PRIVATE HEALTH INSURANCE | Attending: Internal Medicine

## 2019-11-27 DIAGNOSIS — Z23 Encounter for immunization: Secondary | ICD-10-CM

## 2019-11-27 NOTE — Progress Notes (Signed)
   Covid-19 Vaccination Clinic  Name:  Bianca Foster    MRN: [id number] DOB: Nov 20, 1950  11/27/2019  [name] was observed post Covid-19 immunization for 15 minutes without incident. She was provided with Vaccine Information Sheet and instruction to access the V-Safe system.   [name] was instructed to call 911 with any severe reactions post vaccine: Marland Kitchen Difficulty breathing  . Swelling of face and throat  . A fast heartbeat  . A bad rash all over body  . Dizziness and weakness   Immunizations Administered    Name Date Dose VIS Date Route   Pfizer COVID-19 Vaccine 11/27/2019  4:50 PM 0.3 mL 08/31/2019 Intramuscular   Manufacturer: [place]   Lot: [id number]   [place]: [id number]

## 2019-11-28 ENCOUNTER — Ambulatory Visit: Payer: PRIVATE HEALTH INSURANCE

## 2019-12-25 DIAGNOSIS — H353131 Nonexudative age-related macular degeneration, bilateral, early dry stage: Secondary | ICD-10-CM | POA: Diagnosis not present

## 2019-12-25 DIAGNOSIS — Z961 Presence of intraocular lens: Secondary | ICD-10-CM | POA: Diagnosis not present

## 2019-12-25 DIAGNOSIS — H40003 Preglaucoma, unspecified, bilateral: Secondary | ICD-10-CM | POA: Diagnosis not present

## 2020-02-25 DIAGNOSIS — H401131 Primary open-angle glaucoma, bilateral, mild stage: Secondary | ICD-10-CM | POA: Diagnosis not present

## 2020-02-25 DIAGNOSIS — H3530 Unspecified macular degeneration: Secondary | ICD-10-CM | POA: Diagnosis not present

## 2020-02-25 DIAGNOSIS — E785 Hyperlipidemia, unspecified: Secondary | ICD-10-CM | POA: Diagnosis not present

## 2020-02-25 DIAGNOSIS — E119 Type 2 diabetes mellitus without complications: Secondary | ICD-10-CM | POA: Diagnosis not present

## 2020-02-25 DIAGNOSIS — I10 Essential (primary) hypertension: Secondary | ICD-10-CM | POA: Diagnosis not present

## 2020-02-25 DIAGNOSIS — Z0000 Encounter for general adult medical examination without abnormal findings: Secondary | ICD-10-CM | POA: Diagnosis not present

## 2020-04-07 DIAGNOSIS — Z1231 Encounter for screening mammogram for malignant neoplasm of breast: Secondary | ICD-10-CM | POA: Diagnosis not present

## 2020-06-23 DIAGNOSIS — M259 Joint disorder, unspecified: Secondary | ICD-10-CM | POA: Diagnosis not present

## 2020-06-23 DIAGNOSIS — M545 Low back pain, unspecified: Secondary | ICD-10-CM | POA: Diagnosis not present

## 2020-06-23 DIAGNOSIS — M9904 Segmental and somatic dysfunction of sacral region: Secondary | ICD-10-CM | POA: Diagnosis not present

## 2020-06-25 ENCOUNTER — Other Ambulatory Visit: Payer: Self-pay | Admitting: Orthopedic Surgery

## 2020-06-25 DIAGNOSIS — M259 Joint disorder, unspecified: Secondary | ICD-10-CM

## 2020-08-04 ENCOUNTER — Other Ambulatory Visit: Payer: PRIVATE HEALTH INSURANCE

## 2020-08-04 ENCOUNTER — Inpatient Hospital Stay: Admission: RE | Admit: 2020-08-04 | Payer: PRIVATE HEALTH INSURANCE | Source: Ambulatory Visit

## 2020-08-20 DIAGNOSIS — M259 Joint disorder, unspecified: Secondary | ICD-10-CM | POA: Diagnosis not present

## 2020-08-20 DIAGNOSIS — M5459 Other low back pain: Secondary | ICD-10-CM | POA: Diagnosis not present

## 2020-08-26 DIAGNOSIS — H40003 Preglaucoma, unspecified, bilateral: Secondary | ICD-10-CM | POA: Diagnosis not present

## 2020-08-26 DIAGNOSIS — Z961 Presence of intraocular lens: Secondary | ICD-10-CM | POA: Diagnosis not present

## 2020-08-26 DIAGNOSIS — H353131 Nonexudative age-related macular degeneration, bilateral, early dry stage: Secondary | ICD-10-CM | POA: Diagnosis not present

## 2020-09-02 DIAGNOSIS — E785 Hyperlipidemia, unspecified: Secondary | ICD-10-CM | POA: Diagnosis not present

## 2020-09-02 DIAGNOSIS — E119 Type 2 diabetes mellitus without complications: Secondary | ICD-10-CM | POA: Diagnosis not present

## 2020-09-02 DIAGNOSIS — I10 Essential (primary) hypertension: Secondary | ICD-10-CM | POA: Diagnosis not present

## 2020-11-03 ENCOUNTER — Ambulatory Visit: Payer: PRIVATE HEALTH INSURANCE | Admitting: Family Medicine

## 2020-12-02 ENCOUNTER — Ambulatory Visit: Payer: Medicare HMO | Admitting: Family Medicine

## 2020-12-02 ENCOUNTER — Other Ambulatory Visit: Payer: Self-pay

## 2020-12-02 ENCOUNTER — Ambulatory Visit (INDEPENDENT_AMBULATORY_CARE_PROVIDER_SITE_OTHER): Payer: Medicare HMO

## 2020-12-02 ENCOUNTER — Encounter: Payer: Self-pay | Admitting: Family Medicine

## 2020-12-02 VITALS — BP 120/90 | HR 75 | Ht 62.0 in | Wt 159.0 lb

## 2020-12-02 DIAGNOSIS — E119 Type 2 diabetes mellitus without complications: Secondary | ICD-10-CM | POA: Insufficient documentation

## 2020-12-02 DIAGNOSIS — M4316 Spondylolisthesis, lumbar region: Secondary | ICD-10-CM | POA: Diagnosis not present

## 2020-12-02 DIAGNOSIS — I10 Essential (primary) hypertension: Secondary | ICD-10-CM | POA: Insufficient documentation

## 2020-12-02 DIAGNOSIS — K581 Irritable bowel syndrome with constipation: Secondary | ICD-10-CM | POA: Insufficient documentation

## 2020-12-02 DIAGNOSIS — E785 Hyperlipidemia, unspecified: Secondary | ICD-10-CM | POA: Insufficient documentation

## 2020-12-02 DIAGNOSIS — M545 Low back pain, unspecified: Secondary | ICD-10-CM

## 2020-12-02 DIAGNOSIS — M791 Myalgia, unspecified site: Secondary | ICD-10-CM | POA: Diagnosis not present

## 2020-12-02 DIAGNOSIS — M5441 Lumbago with sciatica, right side: Secondary | ICD-10-CM | POA: Diagnosis not present

## 2020-12-02 DIAGNOSIS — G8929 Other chronic pain: Secondary | ICD-10-CM

## 2020-12-02 IMAGING — DX DG LUMBAR SPINE COMPLETE 4+V
5 series · 5 of 5 positions shown · non-contrast
Comparison: None.

CLINICAL DATA: Hip pain, low back pain

EXAM:
LUMBAR SPINE - COMPLETE 4+ VIEW

[l-spine ap]
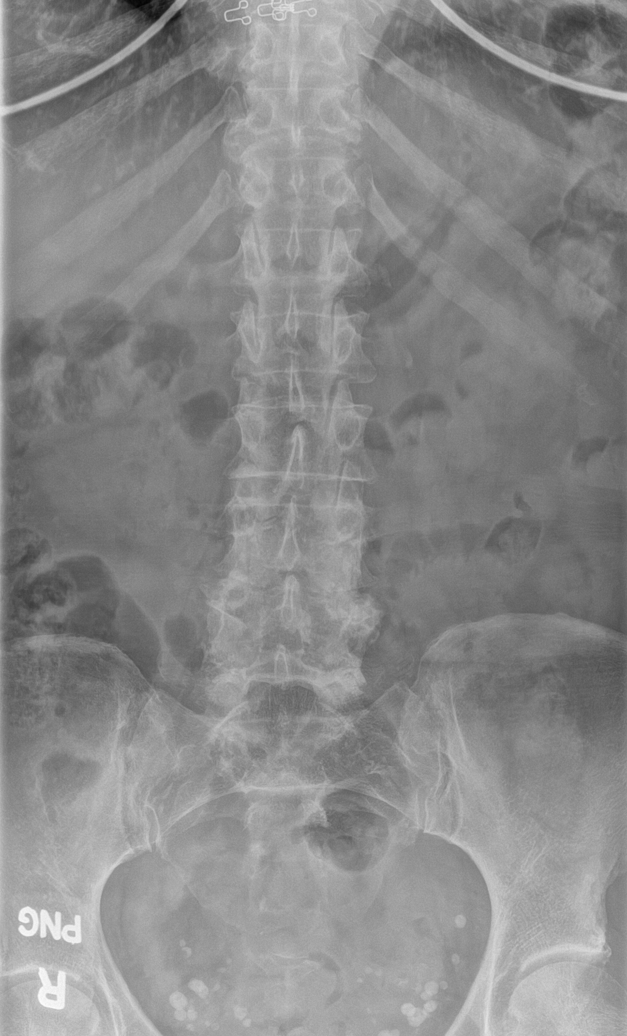

[l-spine obl (1 of 2)]
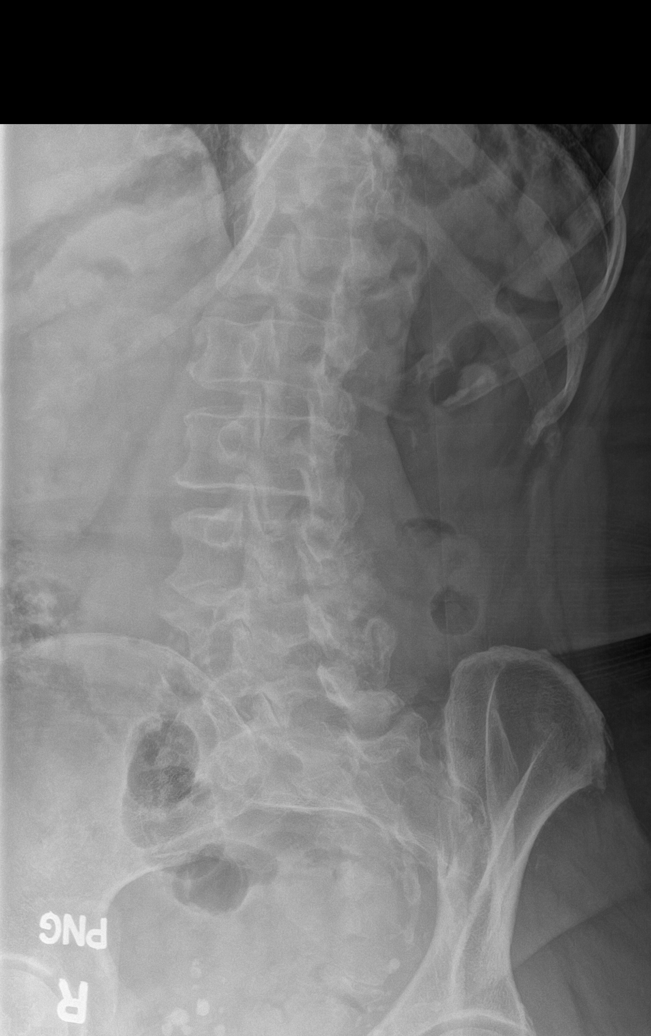

[l-spine obl (2 of 2)]
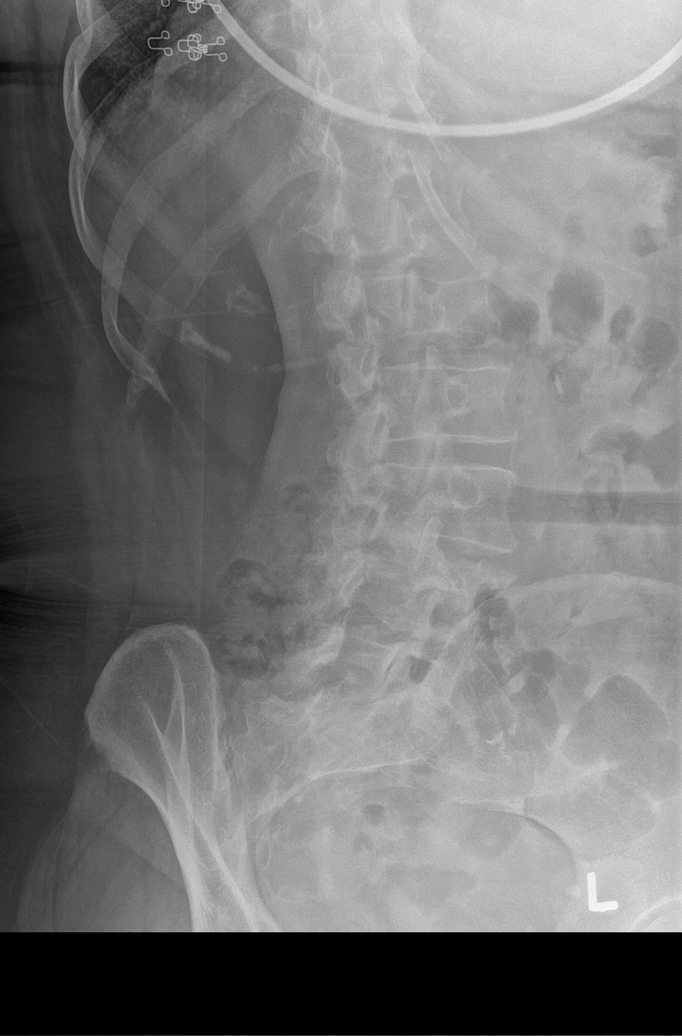

[l-spine lateral]
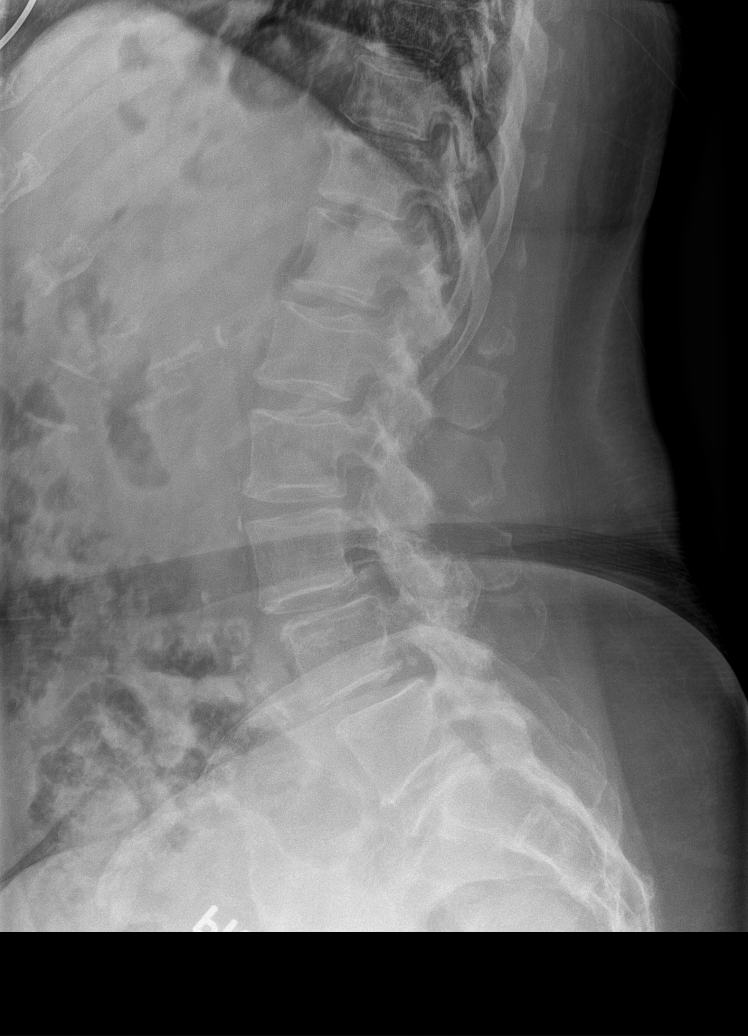

[l-spine spot]
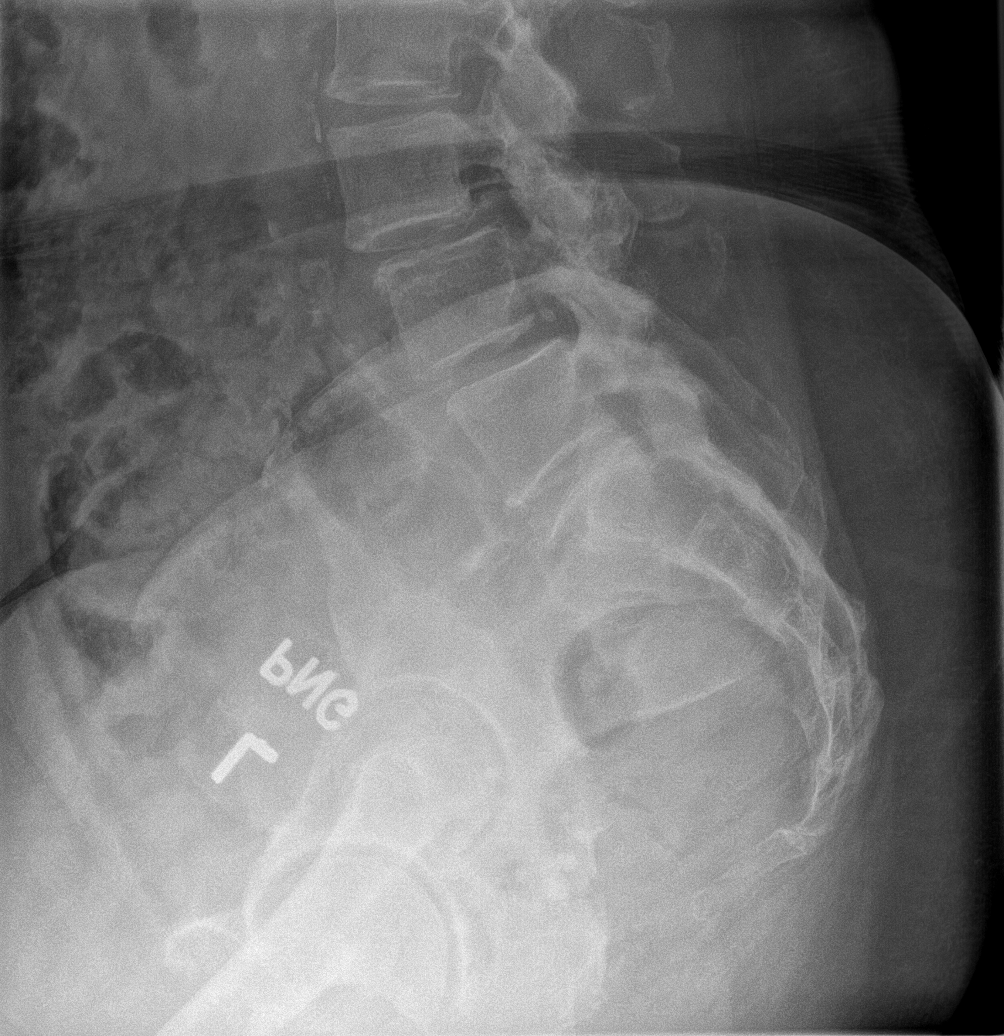

[5 of 5 positions shown; findings below may reference images not displayed]

FINDINGS: Grade 1 anterolisthesis at L3-L4 and L4-L5. Lumbar vertebral body
heights are maintained. Minor disc space narrowing is present. There
is multilevel facet hypertrophy. No evidence of pars break.
IMPRESSION: Facet dominant multilevel degenerative changes.

## 2020-12-02 IMAGING — DX DG PELVIS 1-2V
1 series · 1 of 1 positions shown · non-contrast
Comparison: None

CLINICAL DATA: Low back pain, buttock pain, symptoms for 6 months,
no known injury

EXAM:
PELVIS - 1-2 VIEW

[pelvis ap]
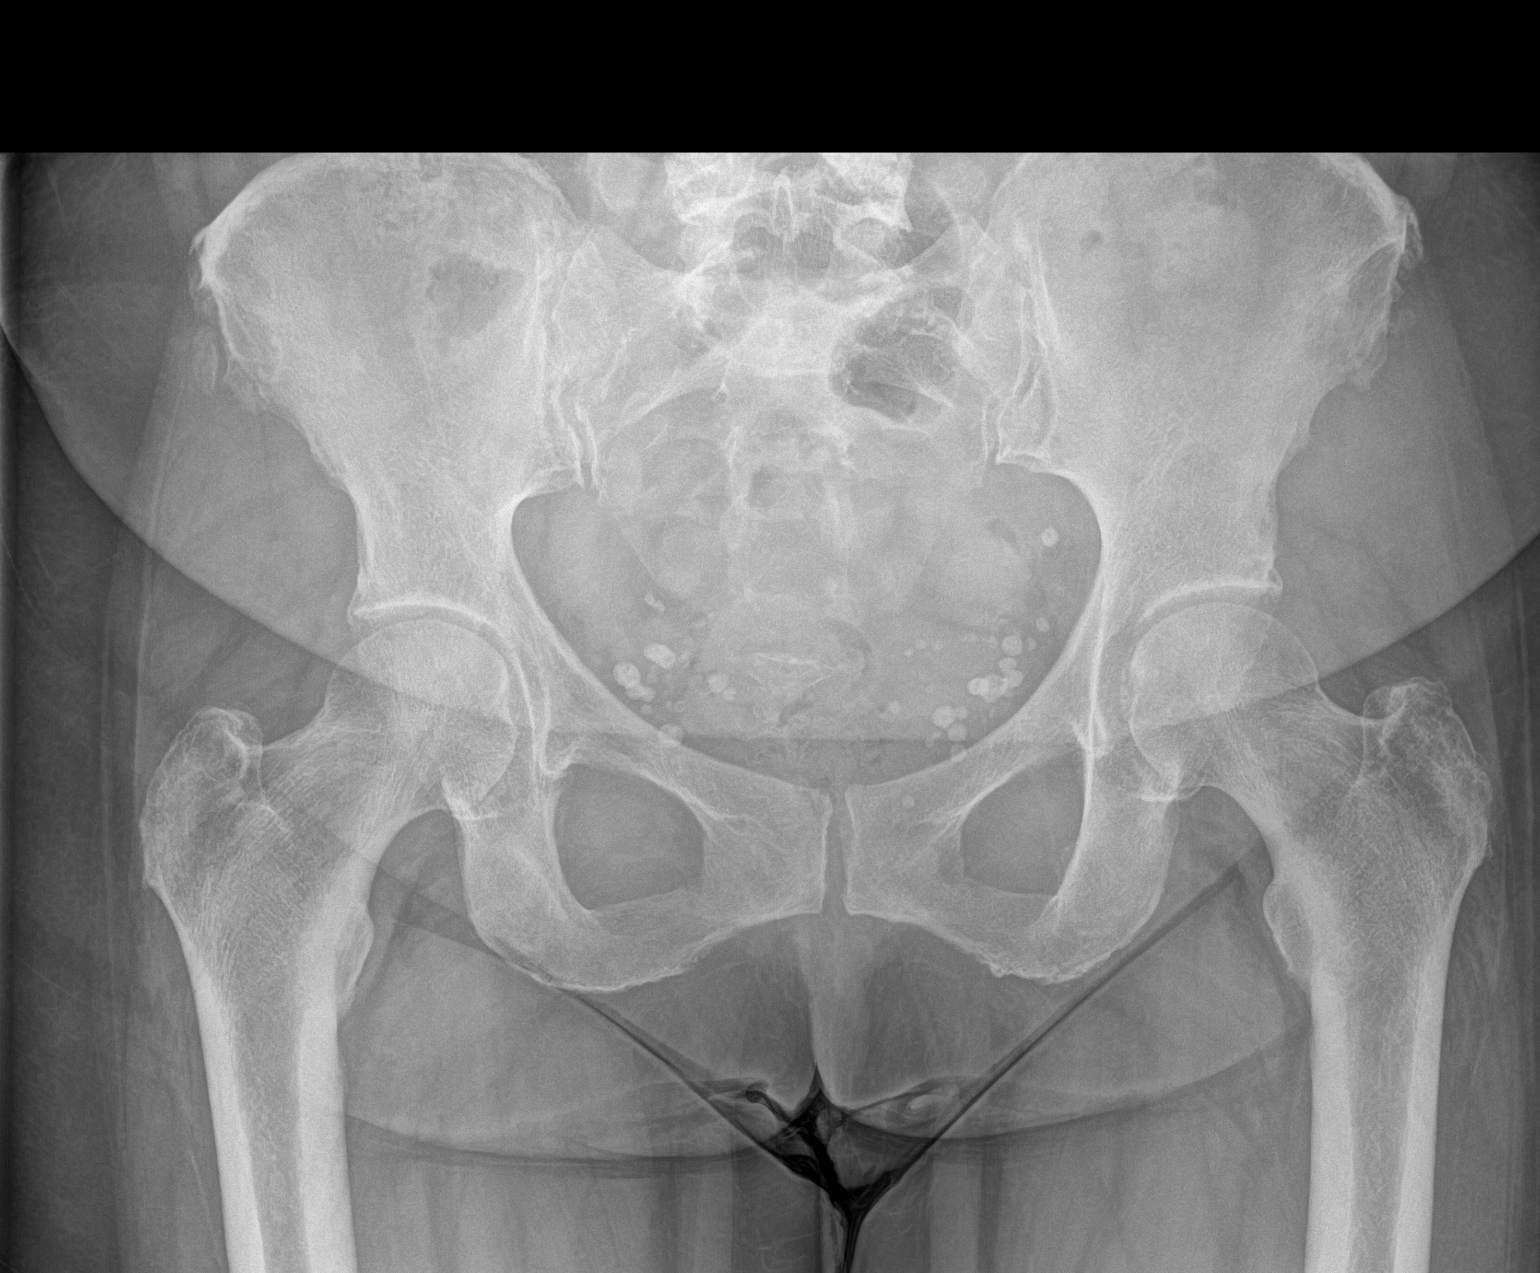

[1 of 1 positions shown; findings below may reference images not displayed]

FINDINGS: Mild osseous demineralization.

Hip and SI joint spaces preserved.

No acute fracture, dislocation, or bone destruction.

Degenerative disc and facet disease changes at visualized lower
lumbar spine.

Numerous BILATERAL pelvic phleboliths.
IMPRESSION: No acute osseous abnormalities.

Degenerative disc and facet disease changes at visualized lower
lumbar spine.

## 2020-12-02 MED ORDER — GABAPENTIN 100 MG PO CAPS: 200.0000 mg | ORAL_CAPSULE | Freq: Every day | ORAL | 3 refills | Status: DC

## 2020-12-02 NOTE — Assessment & Plan Note (Signed)
Patient does have intermittent pain going down the right leg seems to be more in the S1 nerve root impingement.  Possible piriformis syndrome with patient's pain seemed to be mostly in the piriformis area.  Discussed with patient in great length.  Patient feels like she has been doing some exercises but would like some other ones at this time.  We discussed gabapentin and given a very short course.  Patient given 200 mg.  Patient started 100 mg at night and titrate up if necessary.  X-rays of the lumbar spine and the pelvis is ordered today.  Worsening pain would consider the possibility of formal physical therapy.  Otherwise would consider advanced imaging with patient having pain and seen multiple providers.  Patient will follow up again in 4 to 6 weeks

## 2020-12-02 NOTE — Progress Notes (Signed)
[place] [address] [place] Phone: [phone] Subjective:   I Kandace Blitz am serving as a Education administrator for Dr. Hulan Saas.  This visit occurred during the SARS-CoV-2 public health emergency.  Safety protocols were in place, including screening questions prior to the visit, additional usage of staff PPE, and extensive cleaning of exam room while observing appropriate contact time as indicated for disinfecting solutions.   I'm seeing this patient by the request  of:  Maurice Small, MD  CC: Right hip pain  FTD:[id number]  Bianca Foster is a 70 y.o. female coming in with complaint of hip pain. Tingling and burning down to her toes and heel. 3 weeks ago on the left side she states her muscle locked up from the glut to the calf causing stiffness in the leg. History of sciatic nerve issue. 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 Sleeping causes pain as well. Patient is a side sleeper and states anything that touches the right leg causes pain. Left side is doing somewhat better. Seems muscular and nerve related. Believes that most of the time she needs arch support and that the hell feels it needs to be elevated. Has seen Dr. Rolena Infante previously for these issues.   Onset- chronic (last spring/ summer) Location - bilateral SI joints right is worse   Character- burning, tingling, muscle tightness  Aggravating factors- standing, walking  Therapies tried- ice, heat, ibuprofen Severity- Mostly discomfit 7/10      No past medical history on file. No past surgical history on file. Social History   Socioeconomic History  . Marital status: Divorced    Spouse name: Not on file  . Number of children: Not on file  . Years of education: Not on file  . Highest education level: Not on file  Occupational History  . Not on file  Tobacco Use  . Smoking status: Former Smoker    Types: Cigarettes    Quit date: 05/20/2009    Years since  quitting: 11.5  . Smokeless tobacco: Never Used  Substance and Sexual Activity  . Alcohol use: Yes    Alcohol/week: 1.0 standard drink    Types: 1 Glasses of wine per week  . Drug use: No  . Sexual activity: Not on file  Other Topics Concern  . Not on file  Social History Narrative  . Not on file   Social Determinants of Health   Financial Resource Strain: Not on file  Food Insecurity: Not on file  Transportation Needs: Not on file  Physical Activity: Not on file  Stress: Not on file  Social Connections: Not on file   Allergies  Allergen Reactions  . Codeine Nausea And Vomiting   No family history on file.   Current Outpatient Medications (Cardiovascular):  .  amLODipine (NORVASC) 10 MG tablet, Take 1 tablet (10 mg total) by mouth daily. (Patient taking differently: Take 5 mg by mouth daily.) .  metoprolol tartrate (LOPRESSOR) 25 MG tablet, Take 1 tablet (25 mg total) by mouth 2 (two) times daily.  Current Outpatient Medications (Respiratory):  .  cetirizine (ZYRTEC) 10 MG tablet, Take 10 mg by mouth daily as needed for allergies.    Current Outpatient Medications (Other):  .  escitalopram (LEXAPRO) 10 MG tablet, Take 1 tablet (10 mg total) by mouth daily. Marland Kitchen  gabapentin (NEURONTIN) 100 MG capsule, Take 2 capsules (200 mg total) by mouth at bedtime. .  ondansetron (ZOFRAN) 4 MG  tablet, Take 1 tablet (4 mg total) by mouth every 6 (six) hours.   Reviewed prior external information including notes and imaging from  primary care provider As well as notes that were available from care everywhere and other healthcare systems.  Past medical history, social, surgical and family history all reviewed in electronic medical record.  No pertanent information unless stated regarding to the chief complaint.   Review of Systems:  No headache, visual changes, nausea, vomiting, diarrhea, constipation, dizziness, abdominal pain, skin rash, fevers, chills, night sweats, weight loss,  swollen lymph nodes, body aches, joint swelling, chest pain, shortness of breath, mood changes. POSITIVE muscle aches  Objective  Blood pressure 120/90, pulse 75, height 5\' 2"  (1.575 m), weight 159 lb (72.1 kg), SpO2 98 %.   General: No apparent distress alert and oriented x3 mood and affect normal, dressed appropriately.  HEENT: Pupils equal, extraocular movements intact  Respiratory: Patient's speak in full sentences and does not appear short of breath  Cardiovascular: No lower extremity edema, non tender, no erythema  Gait normal with good balance and coordination.  MSK:   Low back exam does have some loss of lordosis.  Some tenderness noted over the right sacroiliac joint.  Severe tightness noted with FABER test and pain over the piriformis noted.  Patient has a negative straight leg test.  Strength is symmetric.  Neurovascularly intact distally.  Pes planus with overpronation of the hindfoot noted.    [id number]; 15 additional minutes spent for Therapeutic exercises as stated in above notes.  This included exercises focusing on stretching, strengthening, with significant focus on eccentric aspects.   Long term goals include an improvement in range of motion, strength, endurance as well as avoiding reinjury. Patient's frequency would include in 1-2 times a day, 3-5 times a week for a duration of 6-12 weeks. Low back exercises that included:  Pelvic tilt/bracing instruction to focus on control of the pelvic girdle and lower abdominal muscles  Glute strengthening exercises, focusing on proper firing of the glutes without engaging the low back muscles Proper stretching techniques for maximum relief for the hamstrings, hip flexors, low back and some rotation where tolerated   Proper technique shown and discussed handout in great detail with ATC.  All questions were discussed and answered.   Impression and Recommendations:     The above documentation has been reviewed and is accurate and complete  [name], DO

## 2020-12-02 NOTE — Patient Instructions (Addendum)
Good to see you 100 mg gabapentin Piriformis exercises Spenco orthotics Lumbar and pelvis xray Tennis ball in back right pocket See me again in 5-6 weeks if not better MRI or PT

## 2021-01-09 NOTE — Progress Notes (Deleted)
[place] Phone: [phone] Subjective:    I'm seeing this patient by the request  of:  Maurice Small, MD  CC: low back pain follow up   [id number]   12/02/2020 Patient does have intermittent pain going down the right leg seems to be more in the S1 nerve root impingement.  Possible piriformis syndrome with patient's pain seemed to be mostly in the piriformis area.  Discussed with patient in great length.  Patient feels like she has been doing some exercises but would like some other ones at this time.  We discussed gabapentin and given a very short course.  Patient given 200 mg.  Patient started 100 mg at night and titrate up if necessary.  X-rays of the lumbar spine and the pelvis is ordered today.  Worsening pain would consider the possibility of formal physical therapy.  Otherwise would consider advanced imaging with patient having pain and seen multiple providers.  Patient will follow up again in 4 to 6 weeks  Update 01/12/2021 Bianca Foster is a 70 y.o. female coming in with complaint of LBP.     Patient did have x-rays at last visit.  X-rays of the lumbar spine showed appropriate alignment anterior listhesis at L3-L4 and L4-L5 with multilevel facet hypertrophy throughout the lumbar spine.  Pelvic x-rays were fairly unremarkable of the hips.  No past medical history on file. No past surgical history on file. Social History   Socioeconomic History  . Marital status: Divorced    Spouse name: Not on file  . Number of children: Not on file  . Years of education: Not on file  . Highest education level: Not on file  Occupational History  . Not on file  Tobacco Use  . Smoking status: Former Smoker    Types: Cigarettes    Quit date: 05/20/2009    Years since quitting: 11.6  . Smokeless tobacco: Never Used  Substance and Sexual Activity  . Alcohol use: Yes    Alcohol/week: 1.0 standard drink    Types: 1  Glasses of wine per week  . Drug use: No  . Sexual activity: Not on file  Other Topics Concern  . Not on file  Social History Narrative  . Not on file   Social Determinants of Health   Financial Resource Strain: Not on file  Food Insecurity: Not on file  Transportation Needs: Not on file  Physical Activity: Not on file  Stress: Not on file  Social Connections: Not on file   Allergies  Allergen Reactions  . Codeine Nausea And Vomiting   No family history on file.   Current Outpatient Medications (Cardiovascular):  .  amLODipine (NORVASC) 10 MG tablet, Take 1 tablet (10 mg total) by mouth daily. (Patient taking differently: Take 5 mg by mouth daily.) .  metoprolol tartrate (LOPRESSOR) 25 MG tablet, Take 1 tablet (25 mg total) by mouth 2 (two) times daily.  Current Outpatient Medications (Respiratory):  .  cetirizine (ZYRTEC) 10 MG tablet, Take 10 mg by mouth daily as needed for allergies.    Current Outpatient Medications (Other):  .  escitalopram (LEXAPRO) 10 MG tablet, Take 1 tablet (10 mg total) by mouth daily. Marland Kitchen  gabapentin (NEURONTIN) 100 MG capsule, Take 2 capsules (200 mg total) by mouth at bedtime. .  ondansetron (ZOFRAN) 4 MG tablet, Take 1 tablet (4 mg total) by mouth every 6 (six) hours.   Reviewed prior external information including  notes and imaging from  primary care provider As well as notes that were available from care everywhere and other healthcare systems.  Past medical history, social, surgical and family history all reviewed in electronic medical record.  No pertanent information unless stated regarding to the chief complaint.   Review of Systems:  No headache, visual changes, nausea, vomiting, diarrhea, constipation, dizziness, abdominal pain, skin rash, fevers, chills, night sweats, weight loss, swollen lymph nodes, body aches, joint swelling, chest pain, shortness of breath, mood changes. POSITIVE muscle aches  Objective  There were no vitals  taken for this visit.   General: No apparent distress alert and oriented x3 mood and affect normal, dressed appropriately.  HEENT: Pupils equal, extraocular movements intact  Respiratory: Patient's speak in full sentences and does not appear short of breath  Cardiovascular: No lower extremity edema, non tender, no erythema  Gait normal with good balance and coordination.  MSK:  Non tender with full range of motion and good stability and symmetric strength and tone of shoulders, elbows, wrist, hip, knee and ankles bilaterally.  Low back exam does have loss of lordosis.   Impression and Recommendations:     The above documentation has been reviewed and is accurate and complete [name], DO

## 2021-01-12 ENCOUNTER — Ambulatory Visit: Payer: Medicare HMO | Admitting: Family Medicine

## 2021-02-05 ENCOUNTER — Ambulatory Visit: Payer: Medicare HMO | Admitting: Family Medicine

## 2021-02-05 ENCOUNTER — Other Ambulatory Visit: Payer: Self-pay

## 2021-02-05 ENCOUNTER — Encounter: Payer: Self-pay | Admitting: Family Medicine

## 2021-02-05 VITALS — BP 118/82 | HR 78 | Ht 62.0 in | Wt 161.0 lb

## 2021-02-05 DIAGNOSIS — M5441 Lumbago with sciatica, right side: Secondary | ICD-10-CM

## 2021-02-05 DIAGNOSIS — G8929 Other chronic pain: Secondary | ICD-10-CM | POA: Diagnosis not present

## 2021-02-05 DIAGNOSIS — M9902 Segmental and somatic dysfunction of thoracic region: Secondary | ICD-10-CM

## 2021-02-05 DIAGNOSIS — M9904 Segmental and somatic dysfunction of sacral region: Secondary | ICD-10-CM | POA: Insufficient documentation

## 2021-02-05 DIAGNOSIS — M9903 Segmental and somatic dysfunction of lumbar region: Secondary | ICD-10-CM

## 2021-02-05 NOTE — Assessment & Plan Note (Signed)

## 2021-02-05 NOTE — Progress Notes (Signed)
[place] Phone: [phone] Subjective:   Bianca Foster, am serving as a scribe for Dr. Hulan Saas. This visit occurred during the SARS-CoV-2 public health emergency.  Safety protocols were in place, including screening questions prior to the visit, additional usage of staff PPE, and extensive cleaning of exam room while observing appropriate contact time as indicated for disinfecting solutions.   I'm seeing this patient by the request  of:  Bianca Small, MD  CC: Low back pain follow-up  [id number]  Bianca Foster is a 70 y.o. female coming in with complaint of low back pain.  Patient was having more of the radiation down the right leg that was consistent with an S1 nerve root impingement.  Differential includes piriformis syndrome.  Patient given home exercises, gabapentin.  Patient states that she has not been taking the gabapentin as she was worried about side effects. Today she notes that all of her muscles feel tight. 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 Patient is curious if her BP medication is causing the muscular pain. Patient continues to have burning in R leg and into the lower back. Has tried exercises and orthotics.    X-rays were taken and last exam 2 months ago.  X-rays were independently visualized by me showing the patient did have facet arthritic changes with mild to moderate mainly at every level of the lumbar spine.  Pelvic x-rays were unremarkable.    Foster past medical history on file. Foster past surgical history on file. Social History   Socioeconomic History  . Marital status: Divorced    Spouse name: Not on file  . Number of children: Not on file  . Years of education: Not on file  . Highest education level: Not on file  Occupational History  . Not on file  Tobacco Use  . Smoking status: Former Smoker    Types: Cigarettes    Quit date: 05/20/2009    Years since  quitting: 11.7  . Smokeless tobacco: Never Used  Substance and Sexual Activity  . Alcohol use: Yes    Alcohol/week: 1.0 standard drink    Types: 1 Glasses of wine per week  . Drug use: Foster  . Sexual activity: Not on file  Other Topics Concern  . Not on file  Social History Narrative  . Not on file   Social Determinants of Health   Financial Resource Strain: Not on file  Food Insecurity: Not on file  Transportation Needs: Not on file  Physical Activity: Not on file  Stress: Not on file  Social Connections: Not on file   Allergies  Allergen Reactions  . Codeine Nausea And Vomiting   Foster family history on file.   Current Outpatient Medications (Cardiovascular):  .  amLODipine (NORVASC) 10 MG tablet, Take 1 tablet (10 mg total) by mouth daily. (Patient taking differently: Take 5 mg by mouth daily.)  Current Outpatient Medications (Respiratory):  .  cetirizine (ZYRTEC) 10 MG tablet, Take 10 mg by mouth daily as needed for allergies.    Current Outpatient Medications (Other):  Marland Kitchen  ALPRAZolam (NIRAVAM) 0.5 MG dissolvable tablet, Take 0.5 mg by mouth at bedtime as needed for anxiety.   Reviewed prior external information including notes and imaging from  primary care provider As well as notes that were available from care everywhere and other healthcare systems.  Past medical history, social, surgical and family history all  reviewed in electronic medical record.  Foster pertanent information unless stated regarding to the chief complaint.   Review of Systems:  Foster headache, visual changes, nausea, vomiting, diarrhea, constipation, dizziness, abdominal pain, skin rash, fevers, chills, night sweats, weight loss, swollen lymph nodes, body aches, joint swelling, chest pain, shortness of breath, mood changes. POSITIVE muscle aches  Objective  Blood pressure 118/82, pulse 78, height 5\' 2"  (1.575 m), weight 161 lb (73 kg), SpO2 99 %.   General: Foster apparent distress alert and oriented x3  mood and affect normal, dressed appropriately.  HEENT: Pupils equal, extraocular movements intact  Respiratory: Patient's speak in full sentences and does not appear short of breath  Cardiovascular: Foster lower extremity edema, non tender, Foster erythema  Gait normal with good balance and coordination.  MSK: Low back exam shows patient does have some mild loss of lordosis.  Tenderness to palpation of the paraspinal musculature of the lumbar spine.  Some pain over the sacroiliac joint bilaterally.  Mild tightness with FABER test.  Worsening pain with extension of the back.  Osteopathic findings  T6 extended rotated and side bent left L2 flexed rotated and side bent left  Sacrum right on right     Impression and Recommendations:    The above documentation has been reviewed and is accurate and complete [name], DO

## 2021-02-05 NOTE — Assessment & Plan Note (Signed)
Low back does show some mild tightness noted.  Patient encouraged to try the gabapentin at 100 mg patient was noncompliant previously.  Patient states that she has been doing some of the exercises.  Attempted osteopathic manipulation today 2.  Patient did have some resolve and some of the symptoms seem most immediately.  Encourage patient to be active otherwise.  Discussed icing regimen and home exercises.  Increase activity slowly.  Follow-up again in 6 weeks

## 2021-02-05 NOTE — Patient Instructions (Signed)
Try 100mg  of gabapentin at night Tried manipulation today See me again in 5-6 weeks

## 2021-03-03 DIAGNOSIS — E1165 Type 2 diabetes mellitus with hyperglycemia: Secondary | ICD-10-CM | POA: Diagnosis not present

## 2021-03-03 DIAGNOSIS — Z0000 Encounter for general adult medical examination without abnormal findings: Secondary | ICD-10-CM | POA: Diagnosis not present

## 2021-03-03 DIAGNOSIS — E785 Hyperlipidemia, unspecified: Secondary | ICD-10-CM | POA: Diagnosis not present

## 2021-03-03 DIAGNOSIS — H401131 Primary open-angle glaucoma, bilateral, mild stage: Secondary | ICD-10-CM | POA: Diagnosis not present

## 2021-03-03 DIAGNOSIS — R002 Palpitations: Secondary | ICD-10-CM | POA: Diagnosis not present

## 2021-03-03 DIAGNOSIS — H3530 Unspecified macular degeneration: Secondary | ICD-10-CM | POA: Diagnosis not present

## 2021-03-03 DIAGNOSIS — I10 Essential (primary) hypertension: Secondary | ICD-10-CM | POA: Diagnosis not present

## 2021-03-13 NOTE — Progress Notes (Signed)
[place] [address] [place] Phone: [phone] Subjective:   I Kandace Blitz am serving as a Education administrator for Dr. Hulan Saas.  This visit occurred during the SARS-CoV-2 public health emergency.  Safety protocols were in place, including screening questions prior to the visit, additional usage of staff PPE, and extensive cleaning of exam room while observing appropriate contact time as indicated for disinfecting solutions.   I'm seeing this patient by the request  of:  Maurice Small, MD  CC: low back pain follow up   [id number]  KYNZLEY DOWSON is a 70 y.o. female coming in with complaint of back and neck pain. OMT 02/05/2021. Patient states she feels about the same today. States she is using ice and heat and it helps. Taking gabapentin one tablet one time a day. States she stopped for a while due to dry eyes. States she is cleared to take it and just uses eye drops. States that her lower leg is sore to the touch as well. States she would like to know what is going on.  Patient feels like the manipulation did not seem to be helpful.  Medications patient has been prescribed: None         Reviewed prior external information including notes and imaging from previsou exam, outside providers and external EMR if available.   As well as notes that were available from care everywhere and other healthcare systems.  Past medical history, social, surgical and family history all reviewed in electronic medical record.  No pertanent information unless stated regarding to the chief complaint.   No past medical history on file.  Allergies  Allergen Reactions   Codeine Nausea And Vomiting     Review of Systems:  No headache, visual changes, nausea, vomiting, diarrhea, constipation, dizziness, abdominal pain, skin rash, fevers, chills, night sweats, weight loss, swollen lymph nodes, joint swelling, chest pain, shortness of breath, mood changes.  POSITIVE muscle aches, Mild body aches  Objective  Blood pressure 130/76, pulse 76, height 5\' 2"  (1.575 m), weight 158 lb (71.7 kg), SpO2 99 %.   General: No apparent distress alert and oriented x3 mood and affect normal, dressed appropriately.  HEENT: Pupils equal, extraocular movements intact  Respiratory: Patient's speak in full sentences and does not appear short of breath  Cardiovascular: No lower extremity edema, non tender, no erythema  Patient does have some tenderness noted in the musculature in multiple different areas.  Still has some loss of lordosis.  Tightness with straight leg test.  Patient does have decreased extension of the back noted.     Assessment and Plan:  Low back pain Patient has significant low back pain.  Patient does have some loss of lordosis and I am concerned that patient's lower extremities including the cramping that patient is suggesting is secondary to more of a spinal stenosis.  We will get MRI of the lumbar spine to further evaluate.  We will consider the possibility of epidurals depending on findings.  Patient would agree with this. Additional patient is concerned there is other reasons for that some of the muscle pains and aches even though they do seem to be mostly of the lower extremity and we will get laboratory work-up to further evaluate.  Patient will follow-up after imaging and laboratory work-up and discuss further at follow-up        The above documentation has been reviewed and is accurate and complete [name], DO  Note: This dictation was prepared with Dragon dictation along with smaller phrase technology. Any transcriptional errors that result from this process are unintentional.

## 2021-03-16 ENCOUNTER — Other Ambulatory Visit: Payer: Self-pay

## 2021-03-16 ENCOUNTER — Encounter: Payer: Self-pay | Admitting: Family Medicine

## 2021-03-16 ENCOUNTER — Ambulatory Visit: Payer: Medicare HMO | Admitting: Family Medicine

## 2021-03-16 VITALS — BP 130/76 | HR 76 | Ht 62.0 in | Wt 158.0 lb

## 2021-03-16 DIAGNOSIS — M2550 Pain in unspecified joint: Secondary | ICD-10-CM

## 2021-03-16 DIAGNOSIS — G8929 Other chronic pain: Secondary | ICD-10-CM

## 2021-03-16 DIAGNOSIS — M545 Low back pain, unspecified: Secondary | ICD-10-CM

## 2021-03-16 LAB — COMPREHENSIVE METABOLIC PANEL
ALT: 18 U/L (ref 0–35)
AST: 19 U/L (ref 0–37)
Albumin: 4.3 g/dL (ref 3.5–5.2)
Alkaline Phosphatase: 76 U/L (ref 39–117)
BUN: 11 mg/dL (ref 6–23)
CO2: 28 mEq/L (ref 19–32)
Calcium: 9.9 mg/dL (ref 8.4–10.5)
Chloride: 104 mEq/L (ref 96–112)
Creatinine, Ser: 0.82 mg/dL (ref 0.40–1.20)
GFR: 71.89 mL/min (ref >60.00)
Glucose, Bld: 104 mg/dL — ABNORMAL HIGH (ref 70–99)
Potassium: 3.8 mEq/L (ref 3.5–5.1)
Sodium: 140 mEq/L (ref 135–145)
Total Bilirubin: 0.3 mg/dL (ref 0.2–1.2)
Total Protein: 7.9 g/dL (ref 6.0–8.3)

## 2021-03-16 LAB — C-REACTIVE PROTEIN: CRP: <1 mg/dL (ref 0.5–20.0)

## 2021-03-16 LAB — CBC WITH DIFFERENTIAL/PLATELET
Basophils Absolute: 0.1 10*3/uL (ref 0.0–0.1)
Basophils Relative: 1.2 % (ref 0.0–3.0)
Eosinophils Absolute: 0.2 10*3/uL (ref 0.0–0.7)
Eosinophils Relative: 3.4 % (ref 0.0–5.0)
HCT: 38.8 % (ref 36.0–46.0)
Hemoglobin: 13 g/dL (ref 12.0–15.0)
Lymphocytes Relative: 46.1 % — ABNORMAL HIGH (ref 12.0–46.0)
Lymphs Abs: 2.7 10*3/uL (ref 0.7–4.0)
MCHC: 33.4 g/dL (ref 30.0–36.0)
MCV: 83.6 fl (ref 78.0–100.0)
Monocytes Absolute: 0.6 10*3/uL (ref 0.1–1.0)
Monocytes Relative: 10.2 % (ref 3.0–12.0)
Neutro Abs: 2.2 10*3/uL (ref 1.4–7.7)
Neutrophils Relative %: 39.1 % — ABNORMAL LOW (ref 43.0–77.0)
Platelets: 230 10*3/uL (ref 150.0–400.0)
RBC: 4.65 Mil/uL (ref 3.87–5.11)
RDW: 15.1 % (ref 11.5–15.5)
WBC: 5.7 10*3/uL (ref 4.0–10.5)

## 2021-03-16 LAB — IBC PANEL
Iron: 60 ug/dL (ref 42–145)
Saturation Ratios: 17.6 % — ABNORMAL LOW (ref 20.0–50.0)
Transferrin: 243 mg/dL (ref 212.0–360.0)

## 2021-03-16 LAB — SEDIMENTATION RATE: Sed Rate: 37 mm/hr — ABNORMAL HIGH (ref 0–30)

## 2021-03-16 LAB — FERRITIN: Ferritin: 70 ng/mL (ref 10.0–291.0)

## 2021-03-16 LAB — TSH: TSH: 0.86 u[IU]/mL (ref 0.35–4.50)

## 2021-03-16 LAB — URIC ACID: Uric Acid, Serum: 5 mg/dL (ref 2.4–7.0)

## 2021-03-16 LAB — VITAMIN D 25 HYDROXY (VIT D DEFICIENCY, FRACTURES): VITD: 24.56 ng/mL — ABNORMAL LOW (ref 30.00–100.00)

## 2021-03-16 NOTE — Assessment & Plan Note (Signed)
Patient has significant low back pain.  Patient does have some loss of lordosis and I am concerned that patient's lower extremities including the cramping that patient is suggesting is secondary to more of a spinal stenosis.  We will get MRI of the lumbar spine to further evaluate.  We will consider the possibility of epidurals depending on findings.  Patient would agree with this. Additional patient is concerned there is other reasons for that some of the muscle pains and aches even though they do seem to be mostly of the lower extremity and we will get laboratory work-up to further evaluate.  Patient will follow-up after imaging and laboratory work-up and discuss further at follow-up

## 2021-03-16 NOTE — Patient Instructions (Addendum)
Good to see you Labs today MRI lumbar  Continue the other medications for now Lets see what comes back  See me again in 6-8 weeks

## 2021-03-17 LAB — CALCIUM, IONIZED

## 2021-03-17 LAB — RHEUMATOID FACTOR

## 2021-03-17 LAB — PTH, INTACT AND CALCIUM: PTH: 25 pg/mL (ref 16–77)

## 2021-03-17 LAB — CYCLIC CITRUL PEPTIDE ANTIBODY, IGG

## 2021-03-17 LAB — ANA

## 2021-03-17 LAB — ANGIOTENSIN CONVERTING ENZYME

## 2021-03-20 LAB — CYCLIC CITRUL PEPTIDE ANTIBODY, IGG: Cyclic Citrullin Peptide Ab: <16 UNITS

## 2021-03-20 LAB — PTH, INTACT AND CALCIUM
Calcium: 9.9 mg/dL (ref 8.6–10.4)
PTH: 26 pg/mL (ref 16–77)

## 2021-03-20 LAB — ANGIOTENSIN CONVERTING ENZYME: Angiotensin-Converting Enzyme: 46 U/L (ref 9–67)

## 2021-03-20 LAB — ANA: Anti Nuclear Antibody (ANA): NEGATIVE

## 2021-03-20 LAB — RHEUMATOID FACTOR: Rheumatoid fact SerPl-aCnc: <14 IU/mL (ref <14)

## 2021-03-20 LAB — CALCIUM, IONIZED: Calcium, Ion: 5.41 mg/dL (ref 4.8–5.6)

## 2021-04-04 ENCOUNTER — Other Ambulatory Visit: Payer: Self-pay

## 2021-04-04 ENCOUNTER — Ambulatory Visit
Admission: RE | Admit: 2021-04-04 | Discharge: 2021-04-04 | Disposition: A | Discharge status: Home / Self Care | Payer: Medicare HMO | Source: Ambulatory Visit | Attending: Family Medicine | Admitting: Family Medicine

## 2021-04-04 DIAGNOSIS — M545 Low back pain, unspecified: Secondary | ICD-10-CM | POA: Diagnosis not present

## 2021-04-04 DIAGNOSIS — G8929 Other chronic pain: Secondary | ICD-10-CM

## 2021-04-04 DIAGNOSIS — M48061 Spinal stenosis, lumbar region without neurogenic claudication: Secondary | ICD-10-CM | POA: Diagnosis not present

## 2021-04-04 IMAGING — MR MR LUMBAR SPINE W/O CM
4 of 5 series · 17 of 48 positions shown · non-contrast
Comparison: Lumbar spine radiographs [DATE].

CLINICAL DATA: Chronic low back pain, unspecified back pain
laterality, unspecified whether sciatica present. Low back pain,
greater than 6 weeks. Additional history provided by scanning
technologist: Patient reports chronic low back pain, numbness and
tingling in right leg, cramping and bilateral legs, symptoms for 1
year.

EXAM:
MRI LUMBAR SPINE WITHOUT CONTRAST
TECHNIQUE: Multiplanar, multisequence MR imaging of the lumbar spine was
performed. No intravenous contrast was administered.

[Series 6: T2 · sagittal · 4.0mm · 0.73mm/px · 6 of 16 slices shown (1 of 2)]
[im 1/16]
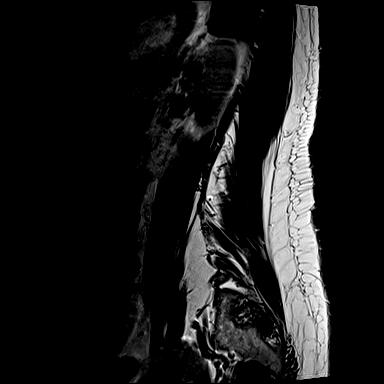
[im 4/16]
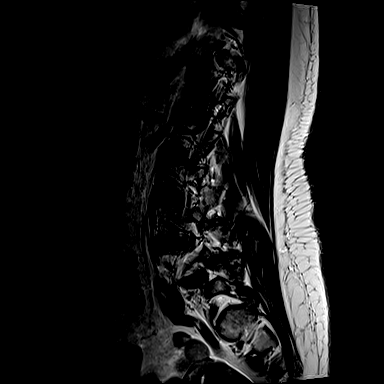
[im 7/16]
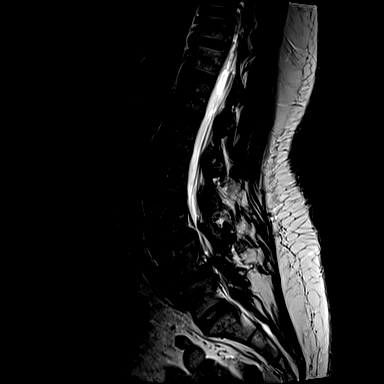
[im 10/16]
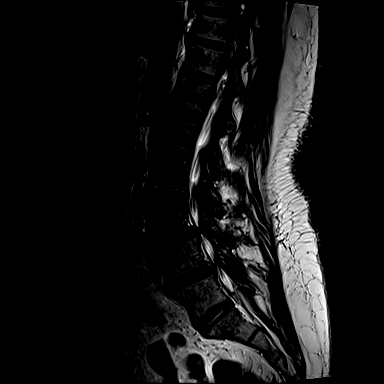
[im 13/16]
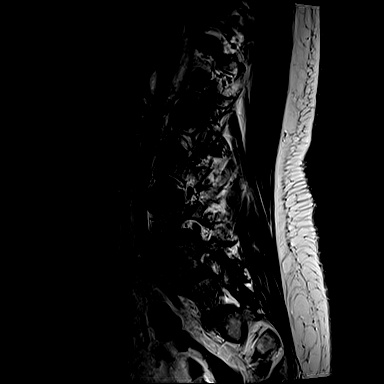
[im 16/16]
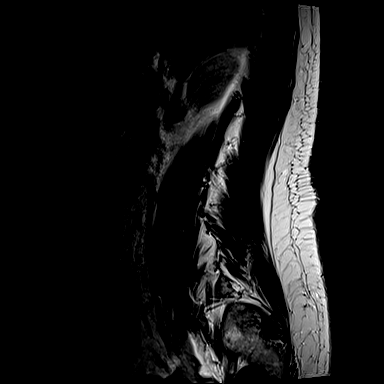

[Series 7: T1 · sagittal · 4.0mm · 0.73mm/px · 3 of 16 slices shown (1 of 2)]
[im 1/16]
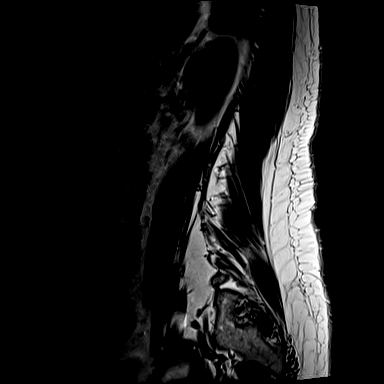
[im 8/16]
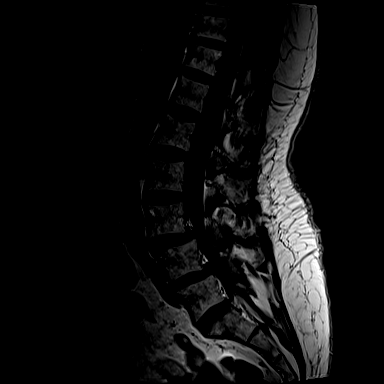
[im 16/16]
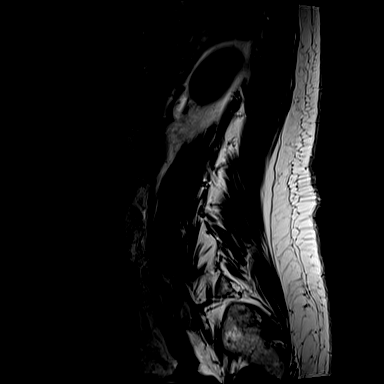

[Series 13: T2 · axial · 4.0mm · 0.28mm/px · z∈[-55,+160]mm · 5 of 49 slices shown (2 of 2)]
[im 4/49]
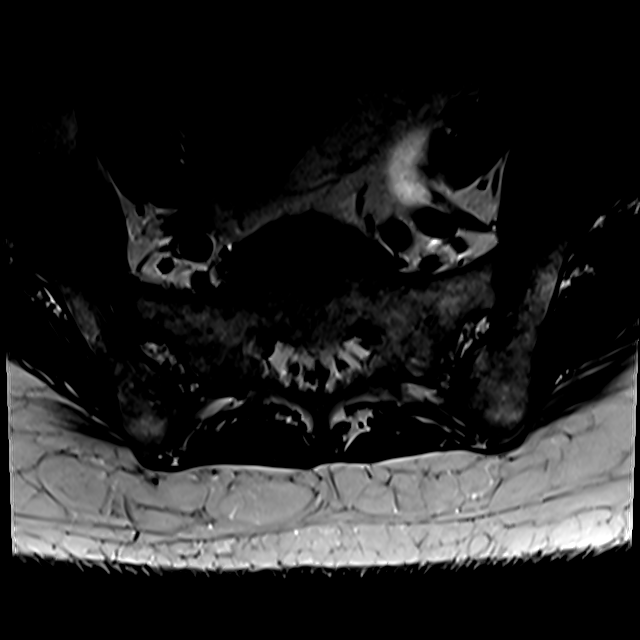
[im 7/49]
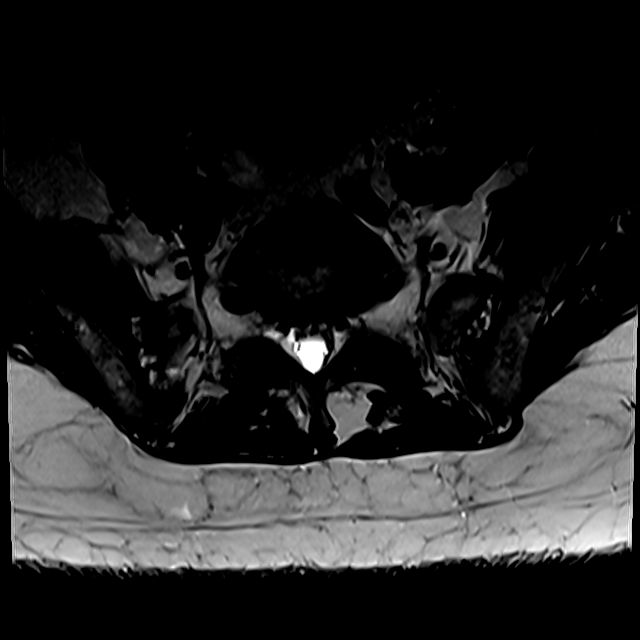
[im 10/49]
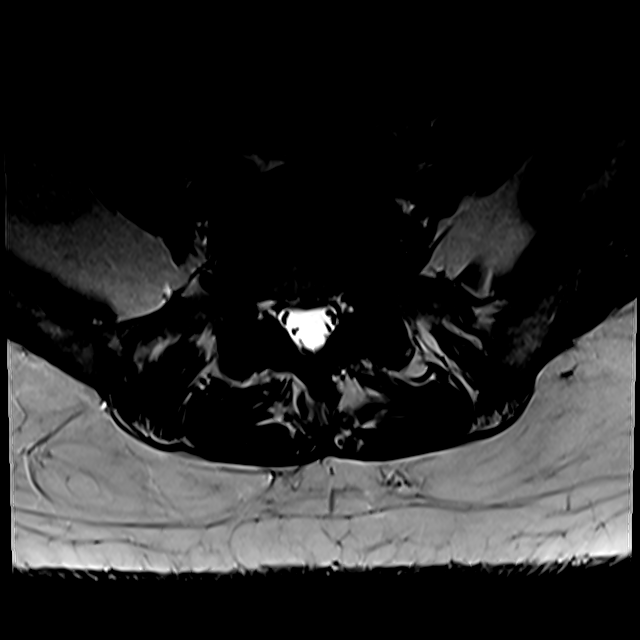
[im 26/49]
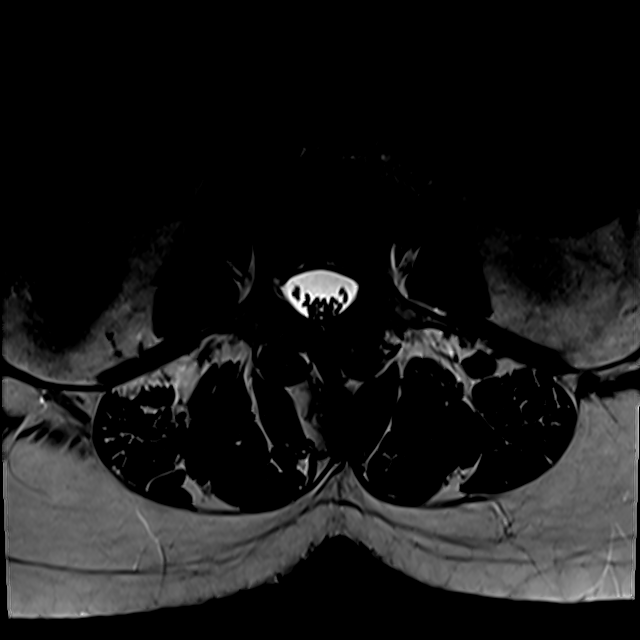
[im 42/49]
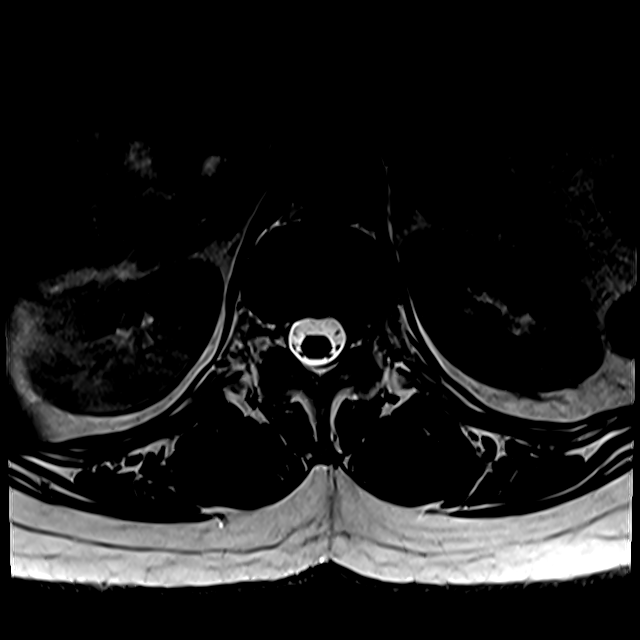

[Series 100: T1 · axial · 4.0mm · 0.28mm/px · z∈[-40,+160]mm · 3 of 49 slices shown (2 of 2)]
[im 7/49]
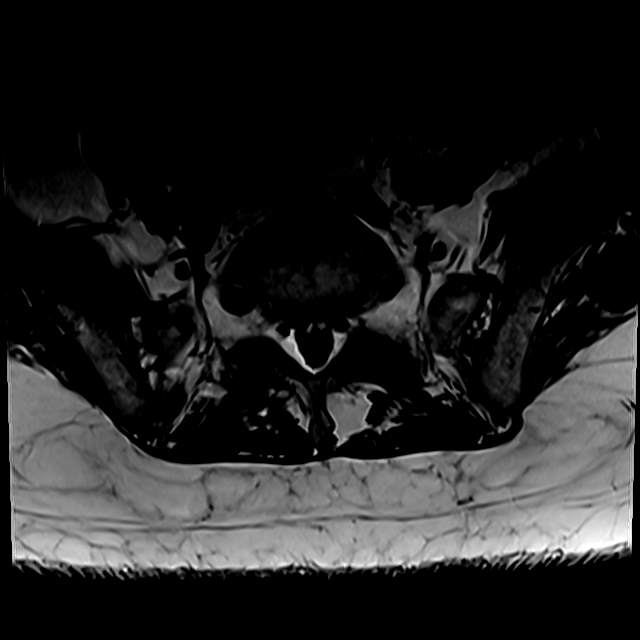
[im 26/49]
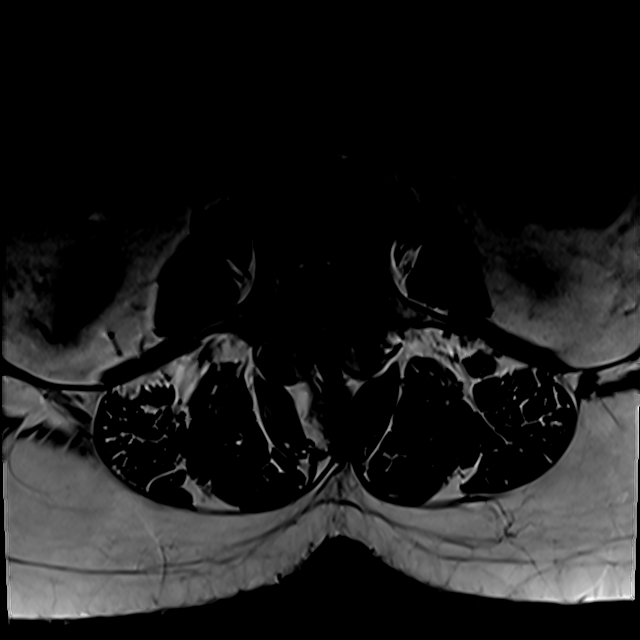
[im 42/49]
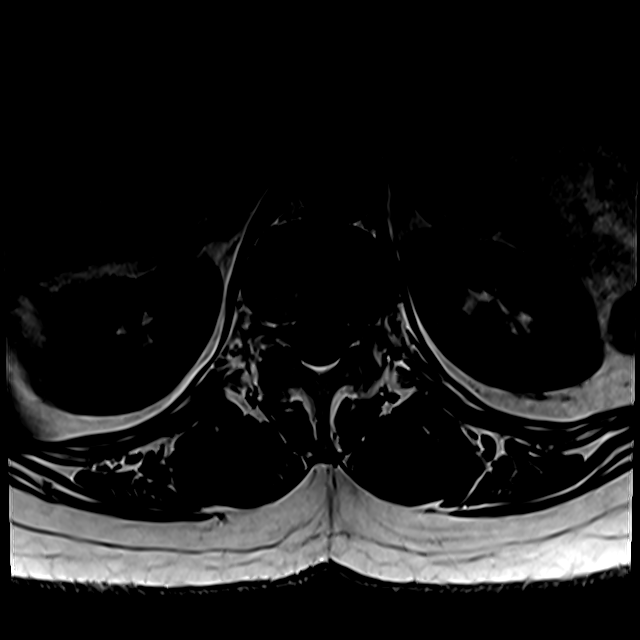

[17 of 48 positions shown; findings below may reference images not displayed]

FINDINGS: Segmentation: For the purposes of this examination, the lowest
well-formed intervertebral disc space is designated L5-S1. In
correlating with the prior lumbar spine radiographs of [DATE],
ribs are absent at the T12 level.

Alignment: 4 mm L3-L4 grade 1 anterolisthesis. 4 mm L4-L5 grade 1
anterolisthesis.

Vertebrae: Vertebral body height is maintained. Edema within the
right L4 and L5 pedicles, which may be degenerative or may reflect
stress reaction. Degenerative edema is also present within the
bilateral L4 and L5 articular pillars. L4 vertebral body hemangioma.
No definite pars interarticularis defect at this time.

Conus medullaris and cauda equina: Conus extends to the L1-L2 level.
No signal abnormality within the visualized distal spinal cord.

Paraspinal and other soft tissues: No abnormality identified within
included portions of the abdomen/retroperitoneum. Mild atrophy of
the lumbar paraspinal musculature.

Disc levels:

Mild-to-moderate disc degeneration at L3-L4 and L4-L5. No more than
mild disc degeneration at the remaining levels.

T9-T10: Imaged sagittally. Facet arthrosis/ligamentum flavum
hypertrophy. No significant disc herniation or stenosis.

T10-T11: Small disc bulge. Minimal facet arthrosis. No significant
spinal canal or foraminal stenosis.

T11-T12: Mild facet arthrosis. No significant disc herniation or
stenosis.

T12-L1: Mild facet arthrosis. No significant disc herniation or
stenosis.

L1-L2: Mild to moderate facet arthrosis. Ligamentum flavum
hypertrophy. No significant spinal canal or foraminal stenosis.

L2-L3: Tiny right foraminal disc protrusion (series 6, image 6).
Mild-to-moderate facet arthrosis. Ligamentum flavum hypertrophy. No
significant spinal canal or foraminal stenosis.

L3-L4: 4 mm grade 1 anterolisthesis. Disc uncovering with disc bulge
and endplate spurring. Superimposed small right center disc
protrusion with minimal cranial migration at site of posterior
annular fissure. A right foraminal zone posterior annular fissure is
also present. Advanced facet arthrosis with ligamentum flavum
hypertrophy. Severe right subarticular stenosis with potential to
affect the descending right L4 nerve root. Mild left subarticular
narrowing. Moderate central canal stenosis. Mild bilateral neural
foraminal narrowing.

L4-L5: 4 mm grade 1 anterolisthesis. Disc uncovering with disc bulge
and endplate spurring. Advanced facet arthrosis with ligamentum
flavum hypertrophy. Moderate bilateral subarticular stenosis with
potential to affect either descending L5 nerve root. Mild central
canal stenosis. Bilateral neural foraminal narrowing (mild right,
moderate left).

L5-S1: Minimal endplate spurring. Mild-to-moderate facet arthrosis.
No significant disc herniation, spinal canal stenosis or neural
foraminal narrowing.
IMPRESSION: The lowest well-formed intervertebral disc space is designated
L5-S1. In correlating with the prior lumbar spine radiographs of
[DATE], ribs appear absent at the T12 level.

Lumbar spondylosis, as outlined and with findings most notably as
follows.

At L3-L4, there is 4 mm grade 1 anterolisthesis. Disc uncovering
with disc bulge and endplate spurring. Superimposed small right
center disc protrusion with minimal cranial migration. Advanced
facet arthrosis with ligamentum flavum hypertrophy. Severe right
subarticular stenosis with potential to affect the descending right
L4 nerve root. Mild left subarticular narrowing. Moderate central
canal stenosis. Mild bilateral neural foraminal narrowing.

At L4-L5, there is 4 mm grade 1 anterolisthesis. Disc uncovering
with disc bulge and endplate spurring. Advanced facet arthrosis with
ligamentum flavum hypertrophy. Moderate bilateral subarticular
stenosis with potential to affect either descending L5 nerve root.
Mild central canal stenosis. Bilateral neural foraminal narrowing
(mild right, moderate left).

No significant spinal canal or foraminal stenosis at the remaining
levels.

Edema within the right L4 and L5 pedicles, which may be degenerative
or may reflect stress reaction.

Degenerative edema within the bilateral L4 and L5 articular pillars.

## 2021-04-06 ENCOUNTER — Encounter: Payer: Self-pay | Admitting: *Deleted

## 2021-04-08 DIAGNOSIS — E559 Vitamin D deficiency, unspecified: Secondary | ICD-10-CM | POA: Diagnosis not present

## 2021-04-13 DIAGNOSIS — Z1231 Encounter for screening mammogram for malignant neoplasm of breast: Secondary | ICD-10-CM | POA: Diagnosis not present

## 2021-04-27 ENCOUNTER — Encounter: Payer: Self-pay | Admitting: Family Medicine

## 2021-04-27 ENCOUNTER — Other Ambulatory Visit: Payer: Self-pay

## 2021-04-27 ENCOUNTER — Ambulatory Visit: Payer: Medicare HMO | Admitting: Family Medicine

## 2021-04-27 VITALS — BP 110/62 | HR 74 | Ht 62.0 in | Wt 154.0 lb

## 2021-04-27 DIAGNOSIS — M545 Low back pain, unspecified: Secondary | ICD-10-CM | POA: Diagnosis not present

## 2021-04-27 DIAGNOSIS — G8929 Other chronic pain: Secondary | ICD-10-CM | POA: Diagnosis not present

## 2021-04-27 NOTE — Patient Instructions (Addendum)
Good to see you  L3-L4 epidural ordered we will see how that does Use the gabapentin as needed Flonase 2 sprays in each nostril OTC See me again in 6 weeks

## 2021-04-27 NOTE — Progress Notes (Signed)
[name] [place] Phone: [phone] Subjective:   Bianca Foster, am serving as a scribe for Dr. Hulan Saas.  I'm seeing this patient by the request  of:  Maurice Small, MD  CC: Low back pain  [id number]  03/16/2021 Patient has significant low back pain.  Patient does have some loss of lordosis and I am concerned that patient's lower extremities including the cramping that patient is suggesting is secondary to more of a spinal stenosis.  We will get MRI of the lumbar spine to further evaluate.  We will consider the possibility of epidurals depending on findings.  Patient would agree with this. Additional patient is concerned there is other reasons for that some of the muscle pains and aches even though they do seem to be mostly of the lower extremity and we will get laboratory work-up to further evaluate.  Patient will follow-up after imaging and laboratory work-up and discuss further at follow-up  Update 04/27/2021 Bianca Foster is a 70 y.o. female coming in with complaint of LBP. Patient states that the back pain is not as extreme but still pretty bad. The mornings are not as hard to get up in the morning and thinks that the gabapentin has helped, takes 1 capsule because the '200mg'$  is too much for her. Patient know the next step is epidural and is wanting to talk about that.       Past Medical History:  Diagnosis Date   DM (diabetes mellitus) ([place])    Hyperlipidemia    Hypertension    IBS (irritable bowel syndrome)    Macular degeneration (senile) of retina    Primary open angle glaucoma of both eyes    Past Surgical History:  Procedure Laterality Date   ROTATOR CUFF REPAIR     TUBAL LIGATION     Social History   Socioeconomic History   Marital status: Divorced    Spouse name: Not on file   Number of children: Not on file   Years of education: Not on file   Highest education level: Not on file   Occupational History   Not on file  Tobacco Use   Smoking status: Former    Types: Cigarettes    Quit date: 05/20/2009    Years since quitting: 11.9   Smokeless tobacco: Never  Substance and Sexual Activity   Alcohol use: Yes    Alcohol/week: 1.0 standard drink    Types: 1 Glasses of wine per week   Drug use: No   Sexual activity: Not on file  Other Topics Concern   Not on file  Social History Narrative   Not on file   Social Determinants of Health   Financial Resource Strain: Not on file  Food Insecurity: Not on file  Transportation Needs: Not on file  Physical Activity: Not on file  Stress: Not on file  Social Connections: Not on file   Allergies  Allergen Reactions   Codeine Nausea And Vomiting   Cortisone     Swollen foot   Prednisone     Swollen shut eyes   Family History  Problem Relation Age of Onset   Alzheimer's disease Mother    Heart disease Father        valve replacement   Hypertension Sister    Hyperlipidemia Sister    Dementia Sister      Current Outpatient Medications (Cardiovascular):    amLODipine (NORVASC) 10 MG tablet, Take 1 tablet (  10 mg total) by mouth daily. (Patient taking differently: Take 5 mg by mouth daily.)  Current Outpatient Medications (Respiratory):    cetirizine (ZYRTEC) 10 MG tablet, Take 10 mg by mouth daily as needed for allergies.    Current Outpatient Medications (Other):    ALPRAZolam (NIRAVAM) 0.5 MG dissolvable tablet, Take 0.5 mg by mouth at bedtime as needed for anxiety.   Bilberry, Vaccinium myrtillus, (BILBERRY PO), Take by mouth.   Biotin 2.5 MG TABS, Take by mouth.   Multiple Vitamin (MULTIVITAMIN) tablet, Take 1 tablet by mouth daily.   Reviewed prior external information including notes and imaging from  primary care provider As well as notes that were available from care everywhere and other healthcare systems.  Past medical history, social, surgical and family history all reviewed in electronic  medical record.  No pertanent information unless stated regarding to the chief complaint.   Review of Systems:  No headache, visual changes, nausea, vomiting, diarrhea, constipation, dizziness, abdominal pain, skin rash, fevers, chills, night sweats, weight loss, swollen lymph nodes, body aches, joint swelling, chest pain, shortness of breath, mood changes. POSITIVE muscle aches  Objective  Blood pressure 110/62, pulse 74, height '5\' 2"'$  (1.575 m), weight 154 lb (69.9 kg), SpO2 99 %.   General: No apparent distress alert and oriented x3 mood and affect normal, dressed appropriately.  HEENT: Pupils equal, extraocular movements intact  Respiratory: Patient's speak in full sentences and does not appear short of breath  Cardiovascular: No lower extremity edema, non tender, no erythema  Gait normal with good balance and coordination.  MSK: Mild arthritic changes in multiple joints. Low back exam does have some mild loss of lordosis.  Some tenderness to palpation of the paraspinal musculature.  Patient was neurovascular intact distally.  Does have difficulty withStanding straight up     Impression and Recommendations:     The above documentation has been reviewed and is accurate and complete [name], DO

## 2021-04-27 NOTE — Assessment & Plan Note (Signed)
Patient is in the low back pain and does have moderate spinal stenosis noted at the L3-L4.  After discussing patient for quite some time discussing patient's other comorbidities including glaucoma as well as the dry eyes and the macular degeneration we decided that A epidural would be most beneficial in trying to avoid medications all the time.  Patient is in agreement with the plan and will have the epidural injection again in 4 to 6 weeks afterwards.  Patient continue with staying active otherwise.  Total time with patient reviewing imaging as well as discussing with her 33 minutes

## 2021-05-01 ENCOUNTER — Telehealth: Payer: Self-pay

## 2021-05-01 NOTE — Telephone Encounter (Signed)
Spoke to patient regarding her allergies to prednisone and cortisone. She reports she developed a "bad stye" in her eye after taking oral prednisone and had increase swelling in her foot after receiving a cortisone shot. However, the patient reports she has had shoulder cortisone injections since with no issues at all. I discussed this patient with Dr. Nelia Shi, who states we can proceed with her steroid injection on 05/04/21 without requiring the patient to take any prep prior. Explained this to the patient, she verbalized understanding.

## 2021-05-04 ENCOUNTER — Other Ambulatory Visit: Payer: Self-pay

## 2021-05-04 ENCOUNTER — Ambulatory Visit
Admission: RE | Admit: 2021-05-04 | Discharge: 2021-05-04 | Disposition: A | Discharge status: Home / Self Care | Payer: Medicare HMO | Source: Ambulatory Visit | Attending: Family Medicine | Admitting: Family Medicine

## 2021-05-04 DIAGNOSIS — M545 Low back pain, unspecified: Secondary | ICD-10-CM

## 2021-05-04 DIAGNOSIS — G8929 Other chronic pain: Secondary | ICD-10-CM

## 2021-05-04 DIAGNOSIS — M48061 Spinal stenosis, lumbar region without neurogenic claudication: Secondary | ICD-10-CM | POA: Diagnosis not present

## 2021-05-04 IMAGING — XA Imaging study
1 series · 1 of 1 positions shown · non-contrast
Comparison: none

CLINICAL DATA: Low back pain. Anterolisthesis and spinal stenosis
L3-4 and L4-5.

[Series 1: ortho standard · 1 of 1 slices shown]
[im 1/1]
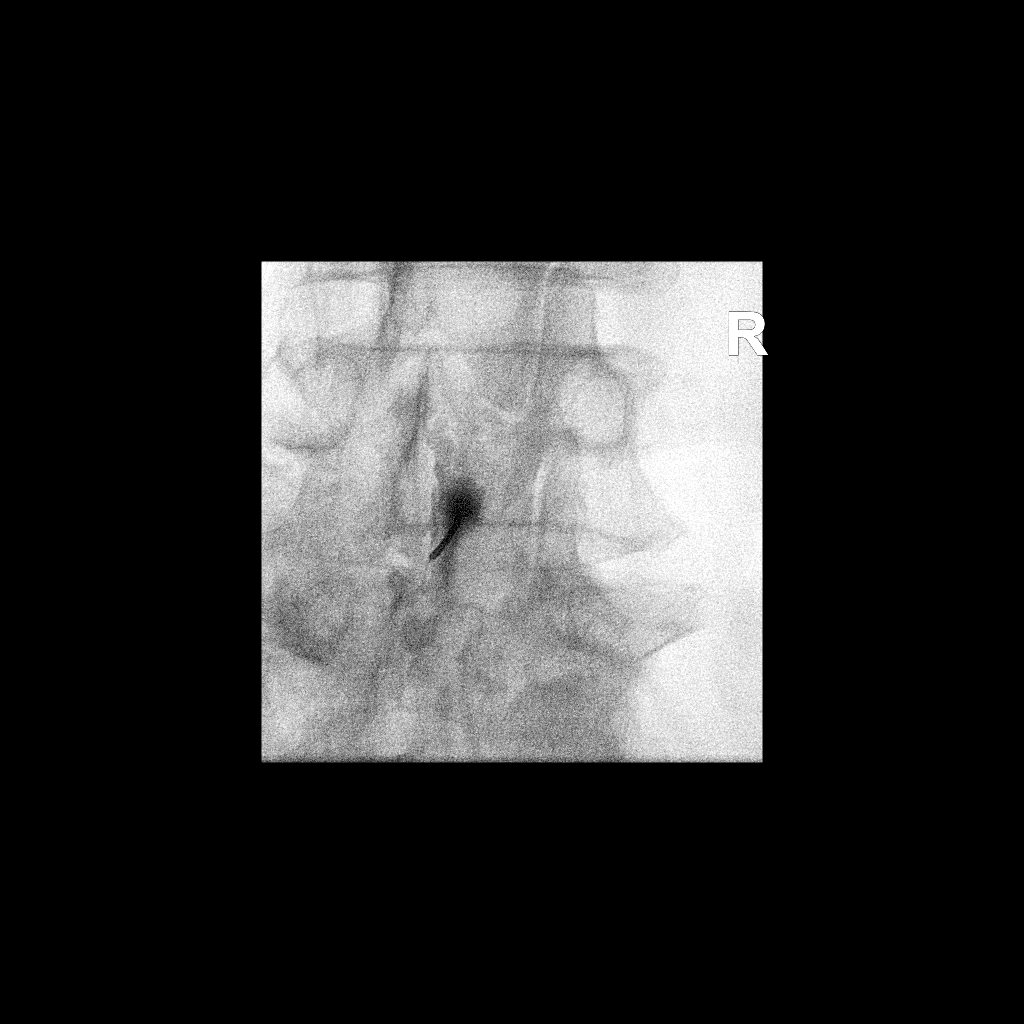

[1 of 1 positions shown; findings below may reference images not displayed]

EXAM:
LUMBAR EPIDURAL INJECTION:

DIAGNOSTIC EPIDURAL INJECTION:

THERAPEUTIC EPIDURAL INJECTION:

PROCEDURE:
The procedure, risks, benefits, and alternatives were explained to
the patient. Questions regarding the procedure were encouraged and
answered. The patient understands and consents to the procedure.

An interlaminar approach was performed on right at L3-4. The
overlying skin was cleansed and anesthetized. A 20 gauge epidural
needle was advanced using loss-of-resistance technique.

Injection of Isovue-M 200 shows a good epidural pattern with spread
above and below the level of needle placement, primarily on the
right no vascular opacification is seen.

80mg of Depo-Medrol mixed with 2ml lidocaine 1% were instilled. The
procedure was well-tolerated, and the patient was discharged thirty
minutes following the injection in good condition.

FLUOROSCOPY TIME:  22 seconds; 14 [UN] DAP

COMPLICATIONS:
None immediate
IMPRESSION: Technically successful epidural injection on the right at L3-4.

## 2021-05-04 MED ORDER — IOPAMIDOL (ISOVUE-M 200) INJECTION 41%
1.0000 mL | Freq: Once | INTRAMUSCULAR | Status: AC
  Administered 2021-05-04: 1 mL via EPIDURAL

## 2021-05-04 MED ORDER — METHYLPREDNISOLONE ACETATE 40 MG/ML INJ SUSP (RADIOLOG
80.0000 mg | Freq: Once | INTRAMUSCULAR | Status: AC
  Administered 2021-05-04: 80 mg via EPIDURAL

## 2021-05-04 NOTE — Discharge Instructions (Signed)

## 2021-05-12 ENCOUNTER — Other Ambulatory Visit: Payer: Self-pay

## 2021-05-12 ENCOUNTER — Encounter: Payer: Self-pay | Admitting: Cardiovascular Disease

## 2021-05-12 ENCOUNTER — Ambulatory Visit: Payer: Medicare HMO | Admitting: Cardiovascular Disease

## 2021-05-12 VITALS — BP 124/78 | HR 84 | Ht 62.0 in | Wt 157.6 lb

## 2021-05-12 DIAGNOSIS — R002 Palpitations: Secondary | ICD-10-CM | POA: Diagnosis not present

## 2021-05-12 NOTE — Progress Notes (Signed)
Cardiology Office Note:    Date:  05/12/2021   ID:  Bianca Foster, Bianca Foster 1950-12-15, MRN [id number]  PCP:  Maurice Small, MD   [facility] Providers Cardiologist:  None     Referring MD: Maurice Small, MD   Chief Complaint  Patient presents with   Palpitations   Hypertension    History of Present Illness:    Bianca Foster is a 70 y.o. female with a hx of worsening palpitations.  We are asked by Dr. Justin Mend to see her for further evaluation of these worsening palpitations.  Bianca Foster has a history of hypertension, type 2 diabetes mellitus, anxiety  She has started drinking some coffee for the past year. Has noted that her palpitations have worsenend since she started drinking coffee.  She also has been adding flavored packets to her drinking water.  Some of these packets may have had caffeine   Seems to be better now The palpitations can last all day long  Works as a Emergency planning/management officer Not much water during the day .  Drinks 64 oz of water when she gets home .   Non smoker  No regular exercise - advised her to exercise more .    Past Medical History:  Diagnosis Date   DM (diabetes mellitus) ([place])    Hyperlipidemia    Hypertension    IBS (irritable bowel syndrome)    Macular degeneration (senile) of retina    Primary open angle glaucoma of both eyes     Past Surgical History:  Procedure Laterality Date   ROTATOR CUFF REPAIR     TUBAL LIGATION      Current Medications: Current Meds  Medication Sig   ALPRAZolam (NIRAVAM) 0.5 MG dissolvable tablet Take 0.5 mg by mouth at bedtime as needed for anxiety.   amLODipine (NORVASC) 10 MG tablet Take 1 tablet (10 mg total) by mouth daily.   Bilberry, Vaccinium myrtillus, (BILBERRY PO) Take by mouth.   Biotin 2.5 MG TABS Take by mouth.   cetirizine (ZYRTEC) 10 MG tablet Take 10 mg by mouth daily as needed for allergies.   gabapentin (NEURONTIN) 100 MG capsule Take 100 mg by mouth at bedtime as needed.   Multiple Vitamin (MULTIVITAMIN)  tablet Take 1 tablet by mouth daily.     Allergies:   Codeine, Cortisone, and Prednisone   Social History   Socioeconomic History   Marital status: Divorced    Spouse name: Not on file   Number of children: Not on file   Years of education: Not on file   Highest education level: Not on file  Occupational History   Not on file  Tobacco Use   Smoking status: Former    Types: Cigarettes    Quit date: 05/20/2009    Years since quitting: 11.9   Smokeless tobacco: Never  Substance and Sexual Activity   Alcohol use: Yes    Alcohol/week: 1.0 standard drink    Types: 1 Glasses of wine per week   Drug use: No   Sexual activity: Not on file  Other Topics Concern   Not on file  Social History Narrative   Not on file   Social Determinants of Health   Financial Resource Strain: Not on file  Food Insecurity: Not on file  Transportation Needs: Not on file  Physical Activity: Not on file  Stress: Not on file  Social Connections: Not on file     Family History: The patient's family history includes Alzheimer's disease in her mother;  Dementia in her sister; Heart disease in her father; Hyperlipidemia in her sister; Hypertension in her sister.  ROS:   Please see the history of present illness.     All other systems reviewed and are negative.  EKGs/Labs/Other Studies Reviewed:    The following studies were reviewed today:   EKG: May 12, 2021: Normal sinus rhythm at 84.  No ST or T wave changes.  Recent Labs: 03/16/2021: ALT 18; BUN 11; Creatinine, Ser 0.82; Hemoglobin 13.0; Platelets 230.0; Potassium 3.8; Sodium 140; TSH 0.86  Recent Lipid Panel    Component Value Date/Time   CHOL 232 (H) 06/06/2013 1523   TRIG 105 06/06/2013 1523   HDL 80 06/06/2013 1523   CHOLHDL 2.9 06/06/2013 1523   VLDL 21 06/06/2013 1523   LDLCALC 131 (H) 06/06/2013 1523     Risk Assessment/Calculations:           Physical Exam:    VS:  BP 124/78   Pulse 84   Ht '5\' 2"'$  (1.575 m)   Wt  157 lb 9.6 oz (71.5 kg)   SpO2 98%   BMI 28.83 kg/m     Wt Readings from Last 3 Encounters:  05/12/21 157 lb 9.6 oz (71.5 kg)  04/27/21 154 lb (69.9 kg)  03/16/21 158 lb (71.7 kg)     GEN:  Well nourished, well developed in no acute distress HEENT: Normal NECK: No JVD; No carotid bruits LYMPHATICS: No lymphadenopathy CARDIAC: RRR, no murmurs, rubs, gallops RESPIRATORY:  Clear to auscultation without rales, wheezing or rhonchi  ABDOMEN: Soft, non-tender, non-distended MUSCULOSKELETAL:  No edema; No deformity  SKIN: Warm and dry NEUROLOGIC:  Alert and oriented x 3 PSYCHIATRIC:  Normal affect   ASSESSMENT:    No diagnosis found. PLAN:    In order of problems listed above:  Palpitations: Clinically palpitations sound like premature ventricular contractions.  They do not cause her any chest pain or shortness of breath.  She has a very poor diet.  She basically does not eat anything for breakfast.  She does not snack on labs through the course of her workday.  When she gets home in the evening she will drink at least 64 ounces of water.  I suggested that she eat a regular breakfast and to drink water through the day.  Alternatively she can add some electrolyte replacement solutions to her water in the form of Nuun tablets or liquid IV.  I encouraged her to ambulate several times a week. She does not need any further work-up.  We will see her on an as-needed basis.        Medication Adjustments/Labs and Tests Ordered: Current medicines are reviewed at length with the patient today.  Concerns regarding medicines are outlined above.  No orders of the defined types were placed in this encounter.  No orders of the defined types were placed in this encounter.   There are no Patient Instructions on file for this visit.   Signed, Mertie Moores, MD  05/12/2021 11:29 AM    [facility]

## 2021-05-12 NOTE — Patient Instructions (Signed)
Medication Instructions:  Your physician recommends that you continue on your current medications as directed. Please refer to the Current Medication list given to you today.  *If you need a refill on your cardiac medications before your next appointment, please call your pharmacy*   Lab Work: None Ordered If you have labs (blood work) drawn today and your tests are completely normal, you will receive your results only by: [place] (if you have MyChart) OR A paper copy in the mail If you have any lab test that is abnormal or we need to change your treatment, we will call you to review the results.   Testing/Procedures: None Ordered   Follow-Up: At [facility], you and your health needs are our priority.  As part of our continuing mission to provide you with exceptional heart care, we have created designated Provider Care Teams.  These Care Teams include your primary Cardiologist (physician) and Advanced Practice Providers (APPs -  Physician Assistants and Nurse Practitioners) who all work together to provide you with the care you need, when you need it.  n do with MyChart, go to [email or web address].    Your next appointment:     As Needed  The format for your next appointment:   In Person or Virtual  Provider:   You may see Mertie Moores, MD or one of the following Advanced Practice Providers on your designated Care Team:   Richardson Dopp, PA-C [place], [place]

## 2021-05-12 NOTE — Patient Instructions (Signed)
Nuun tablet in your water  V-8 juice evey morning  Liquid IV in your water

## 2021-06-04 NOTE — Progress Notes (Signed)
[facility] [place] Phone: [phone] Subjective:   Bianca Foster, am serving as a scribe for Dr. Hulan Saas. This visit occurred during the SARS-CoV-2 public health emergency.  Safety protocols were in place, including screening questions prior to the visit, additional usage of staff PPE, and extensive cleaning of exam room while observing appropriate contact time as indicated for disinfecting solutions.   I'm seeing this patient by the request  of:  Maurice Small, MD  CC: back pain   [id number]  04/27/2021 Patient is in the low back pain and does have moderate spinal stenosis noted at the L3-L4.  After discussing patient for quite some time discussing patient's other comorbidities including glaucoma as well as the dry eyes and the macular degeneration we decided that A epidural would be most beneficial in trying to avoid medications all the time.  Patient is in agreement with the plan and will have the epidural injection again in 4 to 6 weeks afterwards.  Patient continue with staying active otherwise.  Total time with patient reviewing imaging as well as discussing with her 33 minutes  Update  Bianca Foster is a 70 y.o. female coming in with complaint of back pain. Epidural on 05/04/2021. Patient states that her leg continues to bother her more than her back. Injection did help for 2 weeks. Pain in lateral calf on L leg is not as bad but is still present.        Past Medical History:  Diagnosis Date   DM (diabetes mellitus) ([place])    Hyperlipidemia    Hypertension    IBS (irritable bowel syndrome)    Macular degeneration (senile) of retina    Primary open angle glaucoma of both eyes    Past Surgical History:  Procedure Laterality Date   ROTATOR CUFF REPAIR     TUBAL LIGATION     Social History   Socioeconomic History   Marital status: Divorced    Spouse name: Not on file   Number of children: Not on file    Years of education: Not on file   Highest education level: Not on file  Occupational History   Not on file  Tobacco Use   Smoking status: Former    Types: Cigarettes    Quit date: 05/20/2009    Years since quitting: 12.0   Smokeless tobacco: Never  Substance and Sexual Activity   Alcohol use: Yes    Alcohol/week: 1.0 standard drink    Types: 1 Glasses of wine per week   Drug use: Foster   Sexual activity: Not on file  Other Topics Concern   Not on file  Social History Narrative   Not on file   Social Determinants of Health   Financial Resource Strain: Not on file  Food Insecurity: Not on file  Transportation Needs: Not on file  Physical Activity: Not on file  Stress: Not on file  Social Connections: Not on file   Allergies  Allergen Reactions   Codeine Nausea And Vomiting   Cortisone     Swollen foot   Prednisone     Swollen shut eyes   Family History  Problem Relation Age of Onset   Alzheimer's disease Mother    Heart disease Father        valve replacement   Hypertension Sister    Hyperlipidemia Sister    Dementia Sister      Current Outpatient Medications (Cardiovascular):  amLODipine (NORVASC) 10 MG tablet, Take 1 tablet (10 mg total) by mouth daily.  Current Outpatient Medications (Respiratory):    cetirizine (ZYRTEC) 10 MG tablet, Take 10 mg by mouth daily as needed for allergies.    Current Outpatient Medications (Other):    ALPRAZolam (NIRAVAM) 0.5 MG dissolvable tablet, Take 0.5 mg by mouth at bedtime as needed for anxiety.   Bilberry, Vaccinium myrtillus, (BILBERRY PO), Take by mouth.   Biotin 2.5 MG TABS, Take by mouth.   gabapentin (NEURONTIN) 100 MG capsule, Take 100 mg by mouth at bedtime as needed.   Multiple Vitamin (MULTIVITAMIN) tablet, Take 1 tablet by mouth daily.   Reviewed prior external information including notes and imaging from  primary care provider As well as notes that were available from care everywhere and other  healthcare systems.  Past medical history, social, surgical and family history all reviewed in electronic medical record.  Foster pertanent information unless stated regarding to the chief complaint.   Review of Systems:  Foster headache, visual changes, nausea, vomiting, diarrhea, constipation, dizziness, abdominal pain, skin rash, fevers, chills, night sweats, weight loss, swollen lymph nodes, body aches, joint swelling, chest pain, shortness of breath, mood changes. POSITIVE muscle aches  Objective  Blood pressure 116/70, pulse 82, height '5\' 2"'$  (1.575 m), weight 160 lb (72.6 kg), SpO2 98 %.   General: Foster apparent distress alert and oriented x3 mood and affect normal, dressed appropriately.  HEENT: Pupils equal, extraocular movements intact  Respiratory: Patient's speak in full sentences and does not appear short of breath  Cardiovascular: Foster lower extremity edema, non tender, Foster erythema  Gait antalgic  MSK:  low back does have loss of lordosis, mild Positive SLT on the left  NVI distally      Impression and Recommendations:     The above documentation has been reviewed and is accurate and complete [name], DO

## 2021-06-09 ENCOUNTER — Encounter: Payer: Self-pay | Admitting: Family Medicine

## 2021-06-09 ENCOUNTER — Ambulatory Visit: Payer: Medicare HMO | Admitting: Family Medicine

## 2021-06-09 ENCOUNTER — Other Ambulatory Visit: Payer: Self-pay

## 2021-06-09 DIAGNOSIS — G8929 Other chronic pain: Secondary | ICD-10-CM | POA: Diagnosis not present

## 2021-06-09 DIAGNOSIS — M5441 Lumbago with sciatica, right side: Secondary | ICD-10-CM | POA: Diagnosis not present

## 2021-06-09 NOTE — Patient Instructions (Signed)
[place] Will hold on injection but if worsening let me know See me in 7-8 weeks

## 2021-06-09 NOTE — Assessment & Plan Note (Signed)
Patient does not the arthritic changes noted as well as some nerve impingement noted.  Moderate spinal stenosis at L3-L4.  He did get 60 to 70% better after the last injection.  Patient will discontinue the gabapentin secondary to dry eyes.  Patient will start with formal physical therapy now that she is feeling somewhat better and hopefully she will make some progress.  Follow-up with me again in 2 months.  Can repeat the epidural if necessary.  Patient wants to avoid any type of surgical intervention if possible.

## 2021-06-23 DIAGNOSIS — M5416 Radiculopathy, lumbar region: Secondary | ICD-10-CM | POA: Diagnosis not present

## 2021-07-01 DIAGNOSIS — M5416 Radiculopathy, lumbar region: Secondary | ICD-10-CM | POA: Diagnosis not present

## 2021-07-06 DIAGNOSIS — M5416 Radiculopathy, lumbar region: Secondary | ICD-10-CM | POA: Diagnosis not present

## 2021-07-09 DIAGNOSIS — M5416 Radiculopathy, lumbar region: Secondary | ICD-10-CM | POA: Diagnosis not present

## 2021-07-13 DIAGNOSIS — M5416 Radiculopathy, lumbar region: Secondary | ICD-10-CM | POA: Diagnosis not present

## 2021-07-17 DIAGNOSIS — M5416 Radiculopathy, lumbar region: Secondary | ICD-10-CM | POA: Diagnosis not present

## 2021-07-23 NOTE — Progress Notes (Signed)
Bianca Foster [address] [place] Phone: [phone] Subjective:   IVilma Foster, am serving as a scribe for Bianca Foster. This visit occurred during the SARS-CoV-2 public health emergency.  Safety protocols were in place, including screening questions prior to the visit, additional usage of staff PPE, and extensive cleaning of exam room while observing appropriate contact time as indicated for disinfecting solutions.   I'm seeing this patient by the request  of:  Bianca Small, MD (Inactive)  CC: Left leg pain  OHY:[id number]  06/09/2021 Patient does not the arthritic changes noted as well as some nerve impingement noted.  Moderate spinal stenosis at L3-L4.  He did get 60 to 70% better after the last injection.  Patient will discontinue the gabapentin secondary to dry eyes.  Patient will start with formal physical therapy now that she is feeling somewhat better and hopefully she will make some progress.  Follow-up with me again in 2 months.  Can repeat the epidural if necessary.  Patient wants to avoid any type of surgical intervention if possible.  Updated 07/27/2021 Bianca Foster is a 70 y.o. female coming in with complaint of left leg pain. The tingling and burning in the right leg has been getting better with PT. The left leg has gotten progressively worse. The cramping sensation started in the lower leg and has since traveled up into the hamstring. Pain is sporadic and sensitive to palpation. Putting pressure on the limb hurts. Dry needling helped at first but second time around just set the pain off.      Past Medical History:  Diagnosis Date   DM (diabetes mellitus) ([place])    Hyperlipidemia    Hypertension    IBS (irritable bowel syndrome)    Macular degeneration (senile) of retina    Primary open angle glaucoma of both eyes    Past Surgical History:  Procedure Laterality Date   ROTATOR CUFF REPAIR     TUBAL LIGATION      Social History   Socioeconomic History   Marital status: Divorced    Spouse name: Not on file   Number of children: Not on file   Years of education: Not on file   Highest education level: Not on file  Occupational History   Not on file  Tobacco Use   Smoking status: Former    Types: Cigarettes    Quit date: 05/20/2009    Years since quitting: 12.1   Smokeless tobacco: Never  Substance and Sexual Activity   Alcohol use: Yes    Alcohol/week: 1.0 standard drink    Types: 1 Glasses of wine per week   Drug use: No   Sexual activity: Not on file  Other Topics Concern   Not on file  Social History Narrative   Not on file   Social Determinants of Health   Financial Resource Strain: Not on file  Food Insecurity: Not on file  Transportation Needs: Not on file  Physical Activity: Not on file  Stress: Not on file  Social Connections: Not on file   Allergies  Allergen Reactions   Codeine Nausea And Vomiting   Cortisone     Swollen foot   Prednisone     Swollen shut eyes   Family History  Problem Relation Age of Onset   Alzheimer's disease Mother    Heart disease Father        valve replacement   Hypertension Sister    Hyperlipidemia Sister  Dementia Sister      Current Outpatient Medications (Cardiovascular):    amLODipine (NORVASC) 10 MG tablet, Take 1 tablet (10 mg total) by mouth daily.  Current Outpatient Medications (Respiratory):    cetirizine (ZYRTEC) 10 MG tablet, Take 10 mg by mouth daily as needed for allergies.    Current Outpatient Medications (Other):    ALPRAZolam (NIRAVAM) 0.5 MG dissolvable tablet, Take 0.5 mg by mouth at bedtime as needed for anxiety.   Bilberry, Vaccinium myrtillus, (BILBERRY PO), Take by mouth.   Biotin 2.5 MG TABS, Take by mouth.   gabapentin (NEURONTIN) 100 MG capsule, Take 100 mg by mouth at bedtime as needed.   Multiple Vitamin (MULTIVITAMIN) tablet, Take 1 tablet by mouth daily.   tiZANidine (ZANAFLEX) 2 MG tablet,  Take 1 tablet (2 mg total) by mouth at bedtime.   Reviewed prior external information including notes and imaging from  primary care provider As well as notes that were available from care everywhere and other healthcare systems.  Past medical history, social, surgical and family history all reviewed in electronic medical record.  No pertanent information unless stated regarding to the chief complaint.   Review of Systems:  No headache, visual changes, nausea, vomiting, diarrhea, constipation, dizziness, abdominal pain, skin rash, fevers, chills, night sweats, weight loss, swollen lymph nodes,, joint swelling, chest pain, shortness of breath, mood changes. POSITIVE muscle aches, body aches  Objective  Blood pressure 132/76, pulse 78, height 5\' 2"  (1.575 m), weight 158 lb (71.7 kg), SpO2 96 %.   General: No apparent distress alert and oriented x3 mood and affect normal, dressed appropriately.  HEENT: Pupils equal, extraocular movements intact  Respiratory: Patient's speak in full sentences and does not appear short of breath  Cardiovascular: Trace lower extremity edema, non tender, no erythema  Gait antalgic gait favoring the left leg at this time.  Patient back exam does have some loss of lordosis and more tenderness on the paraspinal musculature of the right side of the back still noted.  Left leg patient does have pain to palpation in compression of the calf noted.  Also pain in the popliteal area.  Pain is out of proportion to the amount of palpation noted today.   Limited muscular skeletal ultrasound was performed and interpreted by Bianca Foster, M  Limited ultrasound and patient Shows the patient does have hyperechoic changes of the muscle that can be more consistent with dehydration but no acute tear appreciated.  No abnormal blood flow noted.  Patient's popliteal area is severely tender to palpation to compression with the probe but does seem that the blood vessel is  compressible. Impression: Unremarkable on my ultrasound the patient's pain is out of proportion.    Impression and Recommendations:     The above documentation has been reviewed and is accurate and complete Bianca Pulley, DO

## 2021-07-27 ENCOUNTER — Ambulatory Visit: Payer: Self-pay

## 2021-07-27 ENCOUNTER — Other Ambulatory Visit: Payer: Self-pay

## 2021-07-27 ENCOUNTER — Encounter: Payer: Self-pay | Admitting: Family Medicine

## 2021-07-27 ENCOUNTER — Ambulatory Visit (HOSPITAL_COMMUNITY)
Admission: RE | Admit: 2021-07-27 | Discharge: 2021-07-27 | Disposition: A | Discharge status: Home / Self Care | Payer: Medicare HMO | Source: Ambulatory Visit | Attending: Cardiology | Admitting: Cardiology

## 2021-07-27 ENCOUNTER — Ambulatory Visit (INDEPENDENT_AMBULATORY_CARE_PROVIDER_SITE_OTHER): Payer: Medicare HMO

## 2021-07-27 ENCOUNTER — Ambulatory Visit: Payer: Medicare HMO | Admitting: Family Medicine

## 2021-07-27 VITALS — BP 132/76 | HR 78 | Ht 62.0 in | Wt 158.0 lb

## 2021-07-27 DIAGNOSIS — M1712 Unilateral primary osteoarthritis, left knee: Secondary | ICD-10-CM | POA: Diagnosis not present

## 2021-07-27 DIAGNOSIS — M79605 Pain in left leg: Secondary | ICD-10-CM | POA: Diagnosis not present

## 2021-07-27 DIAGNOSIS — M25562 Pain in left knee: Secondary | ICD-10-CM

## 2021-07-27 DIAGNOSIS — M79662 Pain in left lower leg: Secondary | ICD-10-CM | POA: Diagnosis not present

## 2021-07-27 DIAGNOSIS — M5441 Lumbago with sciatica, right side: Secondary | ICD-10-CM | POA: Diagnosis not present

## 2021-07-27 DIAGNOSIS — G8929 Other chronic pain: Secondary | ICD-10-CM

## 2021-07-27 IMAGING — DX DG KNEE 3 VIEWS*L*
3 series · 3 of 3 positions shown · non-contrast
Comparison: None.

CLINICAL DATA: Arthralgia of left lower leg. Left knee and lower
leg pain. Progressive pain.

EXAM:
LEFT KNEE - 3 VIEW

[knee ap]
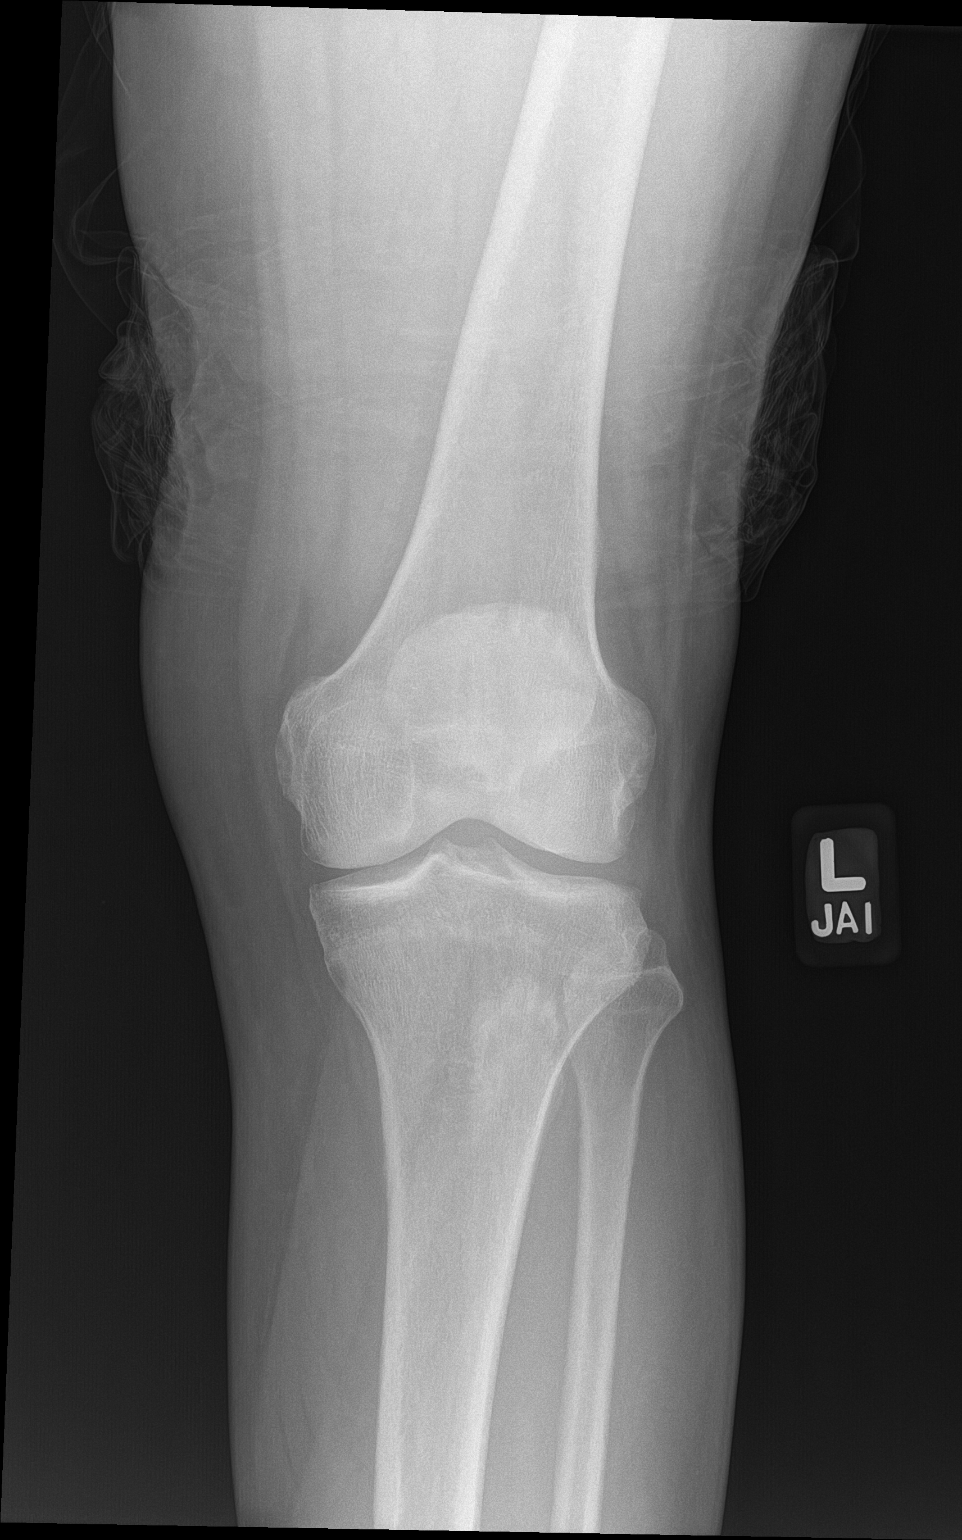

[knee lat]
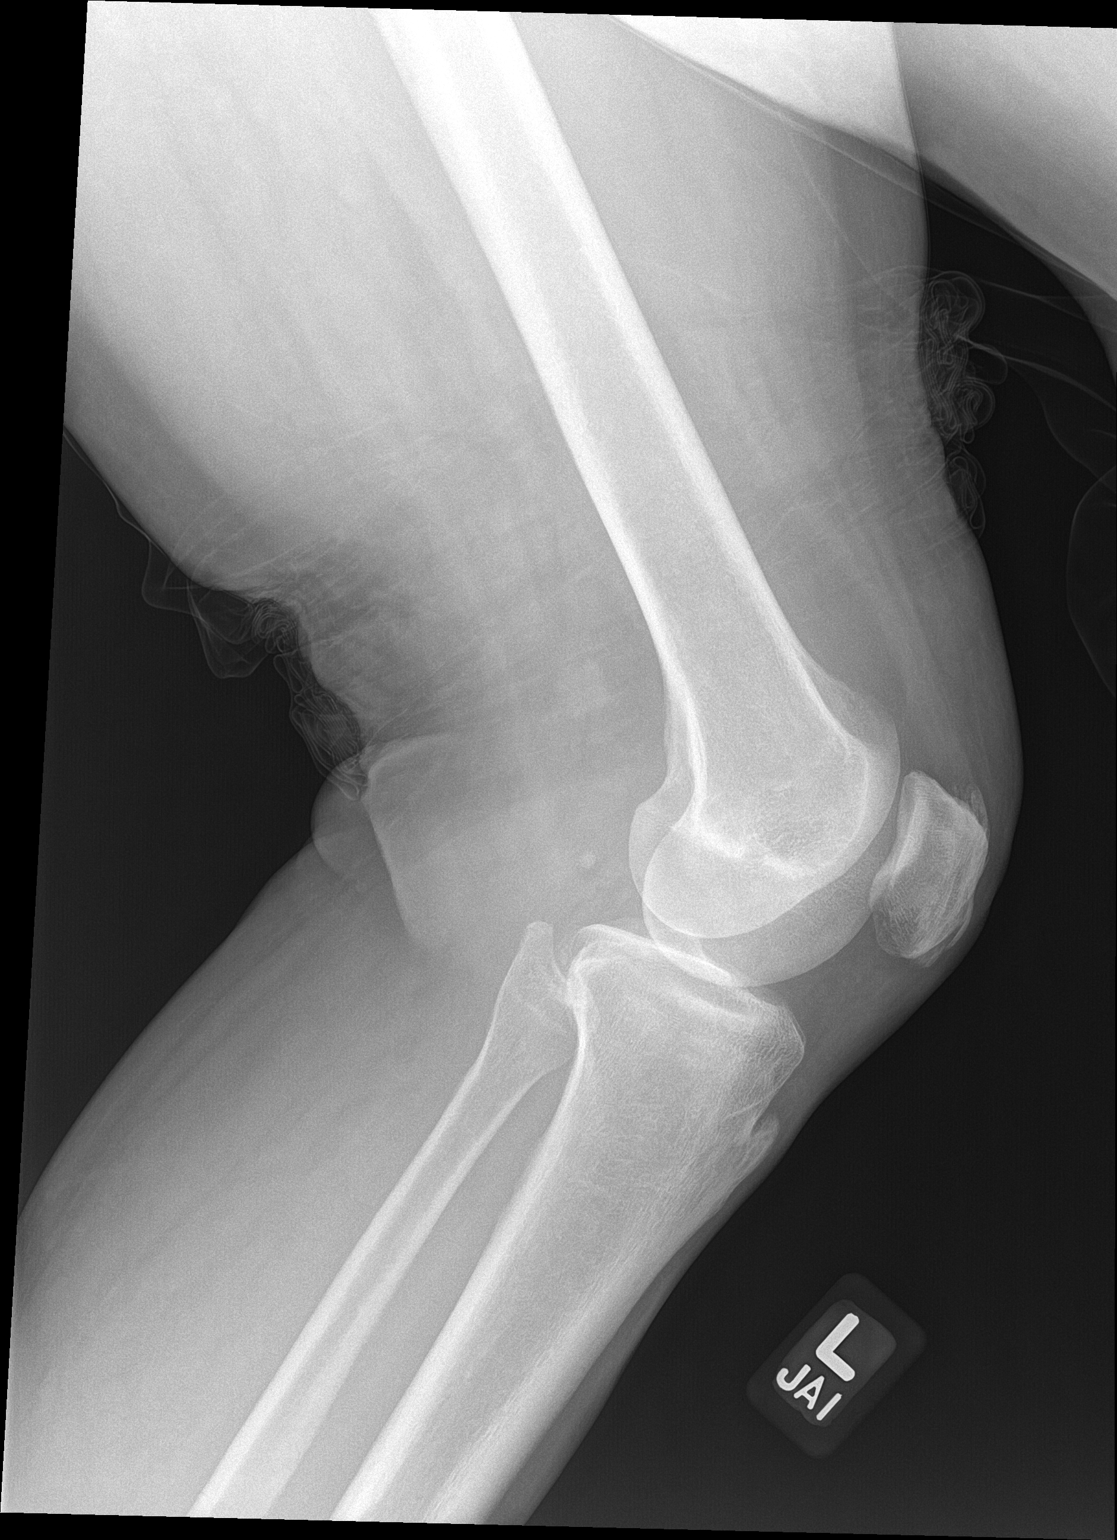

[patella]
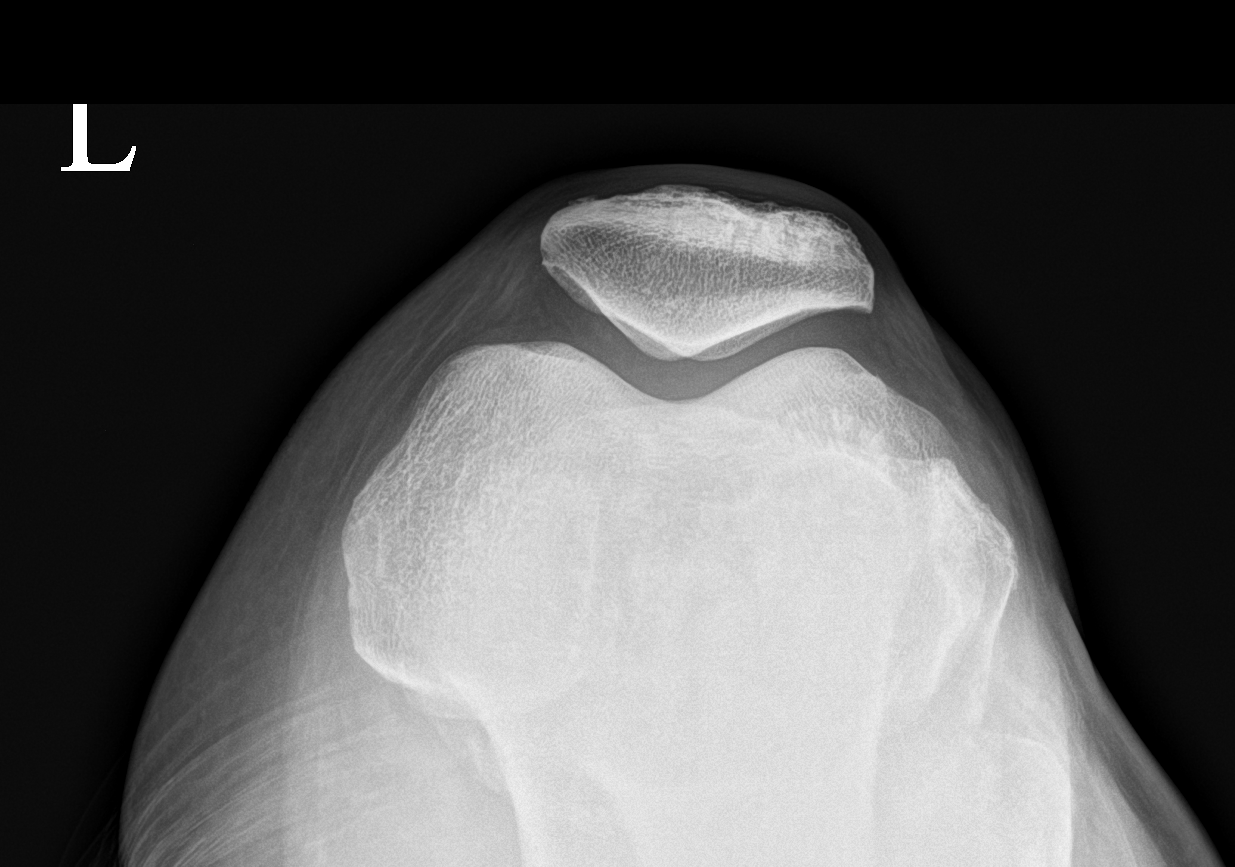

[3 of 3 positions shown; findings below may reference images not displayed]

FINDINGS: Normal alignment. The joint spaces are preserved. There is minimal
peripheral spurring. No fracture, erosion, or focal bone lesions.
Small quadriceps and patellar tendon enthesophytes. No significant
knee joint effusion. No soft tissue abnormalities.
IMPRESSION: Minimal osteoarthritis of the left knee with minimal peripheral
spurring.

## 2021-07-27 MED ORDER — TIZANIDINE HCL 2 MG PO TABS: 2.0000 mg | ORAL_TABLET | Freq: Every day | ORAL | 0 refills | Status: DC

## 2021-07-27 MED ORDER — TIZANIDINE HCL 2 MG PO TABS: 2.0000 mg | ORAL_TABLET | Freq: Every day | ORAL | 0 refills | Status: AC

## 2021-07-27 NOTE — Assessment & Plan Note (Signed)
Patient is having left leg pain.  Seems to be out of proportion to the amount of palpation noted today.  On ultrasound today does appear that the blood vessels are compressible but I do feel that further imaging would be beneficial.  Discussed with patient about if this is normal that this could be more of a lumbar radiculopathy and would encourage patient to consider the gabapentin.  On ultrasound today patient did have what appeared to be more dehydration noted.  We have gotten labs on her previously that did not have any significant findings.  We will monitor and discuss after x-rays and Doppler done.

## 2021-07-27 NOTE — Assessment & Plan Note (Signed)
Patient on back pain seems to be stable at the moment.  Patient is having more pain actually with radicular symptoms down the contralateral leg at the moment.  Patient actually has pain now that seems to start in the calf and go more proximal.  We will focus on that and not make any significant changes.  Not taking the gabapentin at the moment but encouraged her to consider it.  We will follow-up with her back again in 4 to 6 weeks.

## 2021-07-27 NOTE — Patient Instructions (Addendum)
[facility]  ([facility] [employer] in [facility]) [address] [place], [place] [address] [phone] 2:00pm 07/27/2021 arrive at 1:45pm   L knee and tib fib xray Stay hydrated Continue Vit D See me in 4-6 weeks

## 2021-08-04 DIAGNOSIS — H40003 Preglaucoma, unspecified, bilateral: Secondary | ICD-10-CM | POA: Diagnosis not present

## 2021-08-04 DIAGNOSIS — H353131 Nonexudative age-related macular degeneration, bilateral, early dry stage: Secondary | ICD-10-CM | POA: Diagnosis not present

## 2021-08-04 DIAGNOSIS — Z961 Presence of intraocular lens: Secondary | ICD-10-CM | POA: Diagnosis not present

## 2021-08-27 ENCOUNTER — Telehealth: Payer: Self-pay | Admitting: Family Medicine

## 2021-08-27 NOTE — Telephone Encounter (Signed)
Patient called stating that she is having a lot of pain in her left leg. She said that it is on the side and goes up to her butt cheek and feels like it is constricting. #11 on 1-10 pain scale She said that she thinks this is from an injury back in the spring where she twisted her foot and now it is working its way up her leg? She asked if it would be beneficial or order an MRI or what she should do?  Please advise.

## 2021-08-30 NOTE — Telephone Encounter (Signed)
I think if we feel it is back related then an epidural at L4/5 would be next step

## 2021-08-31 ENCOUNTER — Ambulatory Visit: Payer: Medicare HMO | Admitting: Sports Medicine

## 2021-08-31 NOTE — Progress Notes (Deleted)
    Benito Mccreedy D.[place] Phone: [phone]   Assessment and Plan:     There are no diagnoses linked to this encounter.  ***   Pertinent previous records reviewed include ***   Follow Up: ***     Subjective:    I, Moenique Parris, am serving as a Education administrator for [employer]  Chief Complaint: chronic low back pain   HPI:   04/27/2021 Patient is in the low back pain and does have moderate spinal stenosis noted at the L3-L4.  After discussing patient for quite some time discussing patient's other comorbidities including glaucoma as well as the dry eyes and the macular degeneration we decided that A epidural would be most beneficial in trying to avoid medications all the time.  Patient is in agreement with the plan and will have the epidural injection again in 4 to 6 weeks afterwards.  Patient continue with staying active otherwise.  Total time with patient reviewing imaging as well as discussing with her 33 minutes   Update  IRACEMA LANAGAN is a 70 y.o. female coming in with complaint of back pain. Epidural on 05/04/2021. Patient states that her leg continues to bother her more than her back. Injection did help for 2 weeks. Pain in lateral calf on L leg is not as bad but is still present.   08/31/21 Patient states   Relevant Historical Information: ***  Additional pertinent review of systems negative.   Current Outpatient Medications:    ALPRAZolam (NIRAVAM) 0.5 MG dissolvable tablet, Take 0.5 mg by mouth at bedtime as needed for anxiety., Disp: , Rfl:    amLODipine (NORVASC) 10 MG tablet, Take 1 tablet (10 mg total) by mouth daily., Disp: 30 tablet, Rfl: 5   Bilberry, Vaccinium myrtillus, (BILBERRY PO), Take by mouth., Disp: , Rfl:    Biotin 2.5 MG TABS, Take by mouth., Disp: , Rfl:    cetirizine (ZYRTEC) 10 MG tablet, Take 10 mg by mouth daily as needed for allergies., Disp: , Rfl:     gabapentin (NEURONTIN) 100 MG capsule, Take 100 mg by mouth at bedtime as needed., Disp: , Rfl:    Multiple Vitamin (MULTIVITAMIN) tablet, Take 1 tablet by mouth daily., Disp: , Rfl:    tiZANidine (ZANAFLEX) 2 MG tablet, Take 1 tablet (2 mg total) by mouth at bedtime., Disp: 30 tablet, Rfl: 0   Objective:     There were no vitals filed for this visit.    There is no height or weight on file to calculate BMI.    Physical Exam:    ***   Electronically signed by:  Benito Mccreedy D.[facility] 9:27 AM 08/31/21

## 2021-08-31 NOTE — Telephone Encounter (Signed)
Patient called back. Gave Dr Thompson Caul response. Patient is very undecided and does not know what to do. Patient is scheduled to see Dr Glennon Mac to discuss next steps.

## 2021-08-31 NOTE — Telephone Encounter (Signed)
Noted  

## 2021-09-04 NOTE — Progress Notes (Signed)
[place] Phone: [phone] Subjective:   Bianca Foster, am serving as a scribe for Dr. Hulan Saas.This visit occurred during the SARS-CoV-2 public health emergency.  Safety protocols were in place, including screening questions prior to the visit, additional usage of staff PPE, and extensive cleaning of exam room while observing appropriate contact time as indicated for disinfecting solutions.  I'm seeing this patient by the request  of:  Maurice Small, MD  CC: Left leg pain follow-up  PNT:[id number]  07/27/2021 Patient is having left leg pain.  Seems to be out of proportion to the amount of palpation noted today.  On ultrasound today does appear that the blood vessels are compressible but I do feel that further imaging would be beneficial.  Discussed with patient about if this is normal that this could be more of a lumbar radiculopathy and would encourage patient to consider the gabapentin.  On ultrasound today patient did have what appeared to be more dehydration noted.  We have gotten labs on her previously that did not have any significant findings.  We will monitor and discuss after x-rays and Doppler done.  Patient on back pain seems to be stable at the moment.  Patient is having more pain actually with radicular symptoms down the contralateral leg at the moment.  Patient actually has pain now that seems to start in the calf and go more proximal.  We will focus on that and not make any significant changes.  Not taking the gabapentin at the moment but encouraged her to consider it.  We will follow-up with her back again in 4 to 6 weeks.  Update 09/07/2021 Bianca Foster is a 70 y.o. female coming in with complaint of L leg and low back pain. Patient states that she recalls a fall that may have caused leg and back pain about a month ago that caused a "knot" at base of 5th. She then noticed two weeks ago the same knot  over the base of the 5th on left foot. Patient has been taping forefoot  and finds relief from holding ankle in inverted position.   Notes a "crawling" feeling over lateral compartment of lower leg. Notes leg cramps in back of knee that radiates up into the muscle belly of hamstring.       Past Medical History:  Diagnosis Date   DM (diabetes mellitus) ([place])    Hyperlipidemia    Hypertension    IBS (irritable bowel syndrome)    Macular degeneration (senile) of retina    Primary open angle glaucoma of both eyes    Past Surgical History:  Procedure Laterality Date   ROTATOR CUFF REPAIR     TUBAL LIGATION     Social History   Socioeconomic History   Marital status: Divorced    Spouse name: Not on file   Number of children: Not on file   Years of education: Not on file   Highest education level: Not on file  Occupational History   Not on file  Tobacco Use   Smoking status: Former    Types: Cigarettes    Quit date: 05/20/2009    Years since quitting: 12.3   Smokeless tobacco: Never  Substance and Sexual Activity   Alcohol use: Yes    Alcohol/week: 1.0 standard drink    Types: 1 Glasses of wine per week   Drug use: Foster   Sexual activity: Not on file  Other Topics  Concern   Not on file  Social History Narrative   Not on file   Social Determinants of Health   Financial Resource Strain: Not on file  Food Insecurity: Not on file  Transportation Needs: Not on file  Physical Activity: Not on file  Stress: Not on file  Social Connections: Not on file   Allergies  Allergen Reactions   Codeine Nausea And Vomiting   Cortisone     Swollen foot   Prednisone     Swollen shut eyes   Family History  Problem Relation Age of Onset   Alzheimer's disease Mother    Heart disease Father        valve replacement   Hypertension Sister    Hyperlipidemia Sister    Dementia Sister      Current Outpatient Medications (Cardiovascular):    amLODipine (NORVASC) 10 MG tablet,  Take 1 tablet (10 mg total) by mouth daily.  Current Outpatient Medications (Respiratory):    cetirizine (ZYRTEC) 10 MG tablet, Take 10 mg by mouth daily as needed for allergies.    Current Outpatient Medications (Other):    ALPRAZolam (NIRAVAM) 0.5 MG dissolvable tablet, Take 0.5 mg by mouth at bedtime as needed for anxiety.   Bilberry, Vaccinium myrtillus, (BILBERRY PO), Take by mouth.   Biotin 2.5 MG TABS, Take by mouth.   gabapentin (NEURONTIN) 100 MG capsule, Take 100 mg by mouth at bedtime as needed.   Multiple Vitamin (MULTIVITAMIN) tablet, Take 1 tablet by mouth daily.   tiZANidine (ZANAFLEX) 2 MG tablet, Take 1 tablet (2 mg total) by mouth at bedtime.   Reviewed prior external information including notes and imaging from  primary care provider As well as notes that were available from care everywhere and other healthcare systems.  Past medical history, social, surgical and family history all reviewed in electronic medical record.  Foster pertanent information unless stated regarding to the chief complaint.   Review of Systems:  Foster headache, visual changes, nausea, vomiting, diarrhea, constipation, dizziness, abdominal pain, skin rash, fevers, chills, night sweats, weight loss, swollen lymph nodes, body aches, joint swelling, chest pain, shortness of breath, mood changes. POSITIVE muscle aches  Objective  Blood pressure 134/78, pulse 78, height 5\' 2"  (1.575 m), weight 156 lb (70.8 kg), SpO2 98 %.   General: Foster apparent distress alert and oriented x3 mood and affect normal, dressed appropriately.  HEENT: Pupils equal, extraocular movements intact  Respiratory: Patient's speak in full sentences and does not appear short of breath  Cardiovascular: Foster lower extremity edema, non tender, Foster erythema  Gait normal with good balance and coordination.  MSK: Patient's ankle exam shows the patient does have some tenderness over the peroneal tendon.  In addition to this patient is minorly  tender over the base of the fifth metatarsal.  More discomfort between the fourth and fifth toe.  Foster significant swelling noted.   Impression and Recommendations:    The above documentation has been reviewed and is accurate and complete Bianca Pulley, DO

## 2021-09-07 ENCOUNTER — Ambulatory Visit: Payer: Medicare HMO | Admitting: Family Medicine

## 2021-09-07 ENCOUNTER — Encounter: Payer: Self-pay | Admitting: Family Medicine

## 2021-09-07 ENCOUNTER — Ambulatory Visit (INDEPENDENT_AMBULATORY_CARE_PROVIDER_SITE_OTHER): Payer: Medicare HMO

## 2021-09-07 ENCOUNTER — Other Ambulatory Visit: Payer: Self-pay

## 2021-09-07 VITALS — BP 134/78 | HR 78 | Ht 62.0 in | Wt 156.0 lb

## 2021-09-07 DIAGNOSIS — M25572 Pain in left ankle and joints of left foot: Secondary | ICD-10-CM | POA: Diagnosis not present

## 2021-09-07 DIAGNOSIS — G8929 Other chronic pain: Secondary | ICD-10-CM

## 2021-09-07 DIAGNOSIS — M79672 Pain in left foot: Secondary | ICD-10-CM

## 2021-09-07 IMAGING — DX DG FOOT COMPLETE 3+V*L*
3 series · 3 of 3 positions shown · non-contrast
Comparison: None.

CLINICAL DATA: Left lateral foot pain with a knot around the base
of the fifth digit for months. No known injury.

EXAM:
LEFT FOOT - COMPLETE 3+ VIEW

[foot ap]
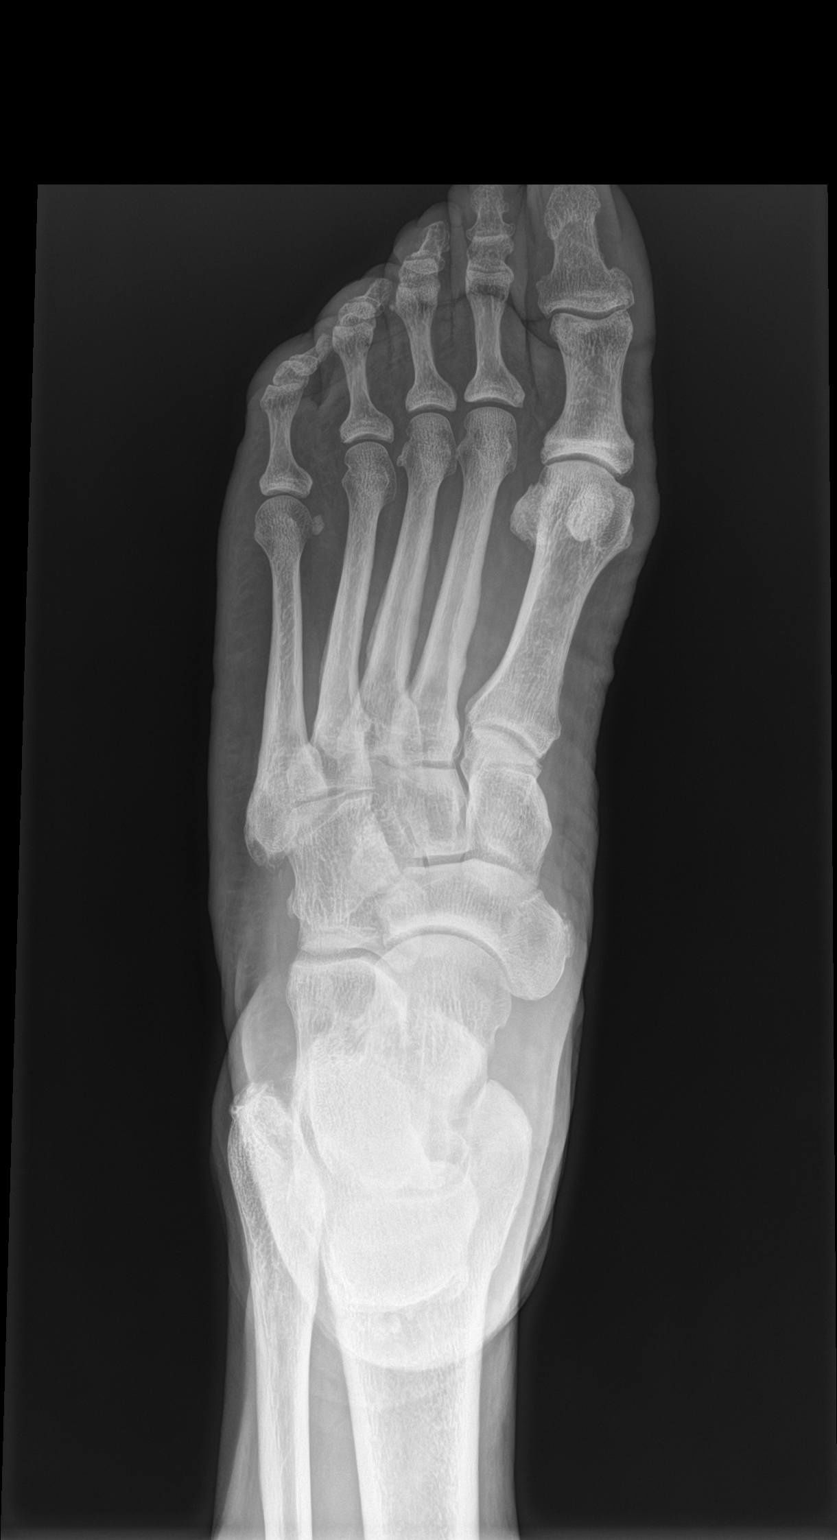

[foot obl]
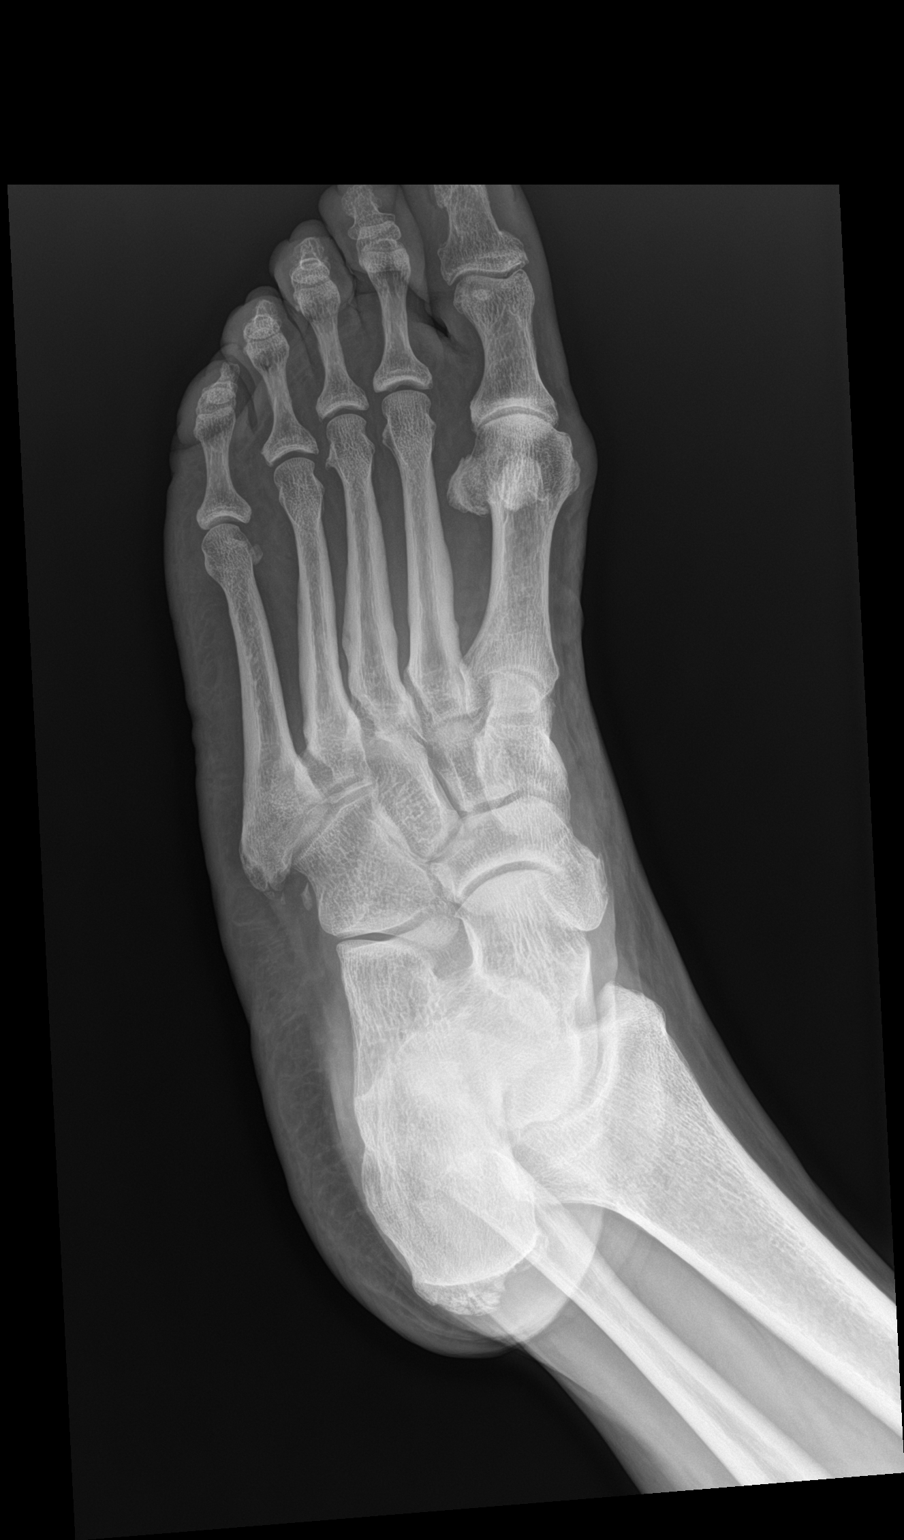

[foot lat]
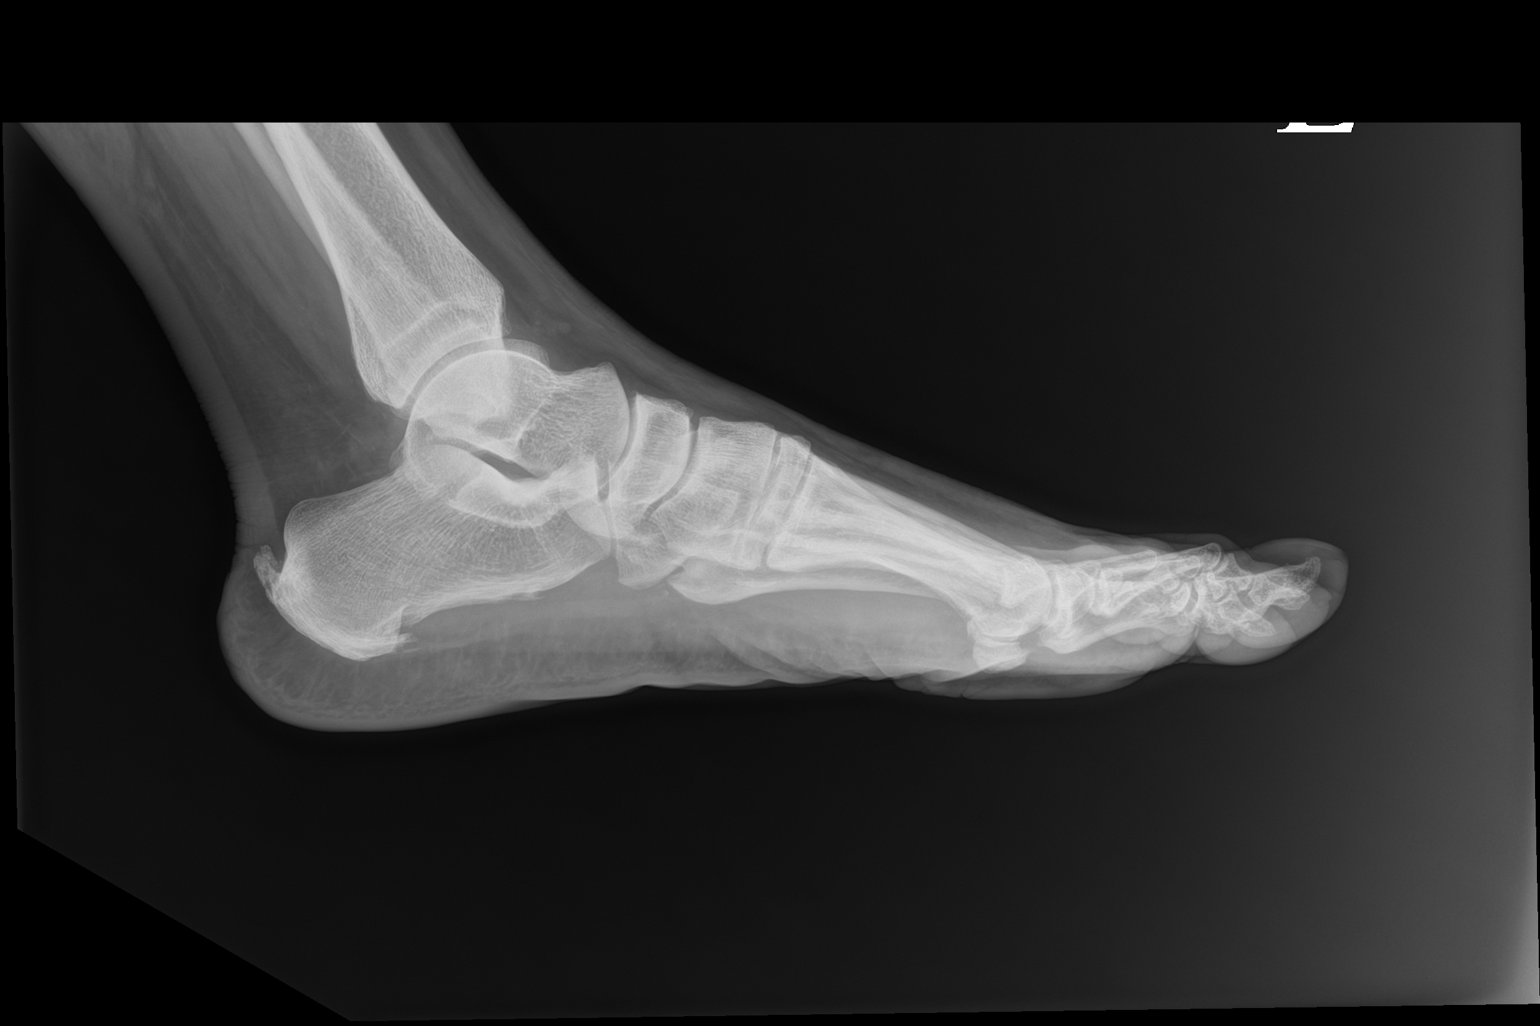

[3 of 3 positions shown; findings below may reference images not displayed]

FINDINGS: Mild enthesopathic changes at the base of the fifth metatarsal.
Small plantar spur. Enthesopathic changes at the posterior calcaneus
at the Achilles insertion site. No fracture or dislocation. Mild
degenerative changes at the first MTP joint.
IMPRESSION: Degenerative and enthesopathic changes as above. Small plantar spur.
No other abnormalities.

## 2021-09-07 NOTE — Assessment & Plan Note (Addendum)
Concerned the patient's left ankle pain with the changes could be more of secondary to either partial tearing of the peroneal tendons.  Discussed icing regimen and home exercises.  Patient given a lateral Aircast that I think will be beneficial for the ankle as well.  Patient given home exercises.  We will get x-rays as well.  Follow-up again 6 weeks.  Worsening pain consider formal physical therapy or advanced imaging.  Did discuss with patient though that also with patient's history of back pain we may want to consider this.  Patient was adamant that this is not coming from her back

## 2021-09-07 NOTE — Patient Instructions (Signed)
Xray L foot Aircast Exercises See me again in 4-6 weeks

## 2021-10-02 NOTE — Progress Notes (Signed)
Bianca Foster [address] [place] Phone: [phone] Subjective:   [name], am serving as a scribe for Dr. Hulan Saas. This visit occurred during the SARS-CoV-2 public health emergency.  Safety protocols were in place, including screening questions prior to the visit, additional usage of staff PPE, and extensive cleaning of exam room while observing appropriate contact time as indicated for disinfecting solutions.   I'm seeing this patient by the request  of:  Maurice Small, MD  CC: Left leg pain  [id number]  09/07/2021 Concerned the patient's left ankle pain with the changes could be more of secondary to either partial tearing of the peroneal tendons.  Discussed icing regimen and home exercises.  Patient given a lateral Aircast that I think will be beneficial for the ankle as well.  Patient given home exercises.  We will get x-rays as well.  Follow-up again 6 weeks.  Worsening pain consider formal physical therapy or advanced imaging.  Did discuss with patient though that also with patient's history of back pain we may want to consider this.  Patient was adamant that this is not coming from her back  Update 10/05/2021 Bianca Foster is a 71 y.o. female coming in with complaint of L foot pain. Patient states foot pain is the same. Wants to know next step. Brace that was given she doesn't wear because of the compression. No new complaints.  Patient states that if anything the pain seems to be worsening.  States it is starting to give her more trouble with daily activities.  Even standing long amount of time gives her difficulty.  Anything more than 100 to 200 feet causes significant amount of discomfort and pain.  Over the course of time we have tried home exercises, x-rays of the tib-fib and knee were fairly unremarkable.  DG L foot 09/07/2022 IMPRESSION: Degenerative and enthesopathic changes as above. Small plantar spur. No other  abnormalities. Patient over the course of time also had a Doppler that was unremarkable. Left knee x-rays taken in November 2022 showed some mild arthritic changes but nothing specific except some mild quadricep and patellar tendinitis.  Patient also had tib-fib x-rays that also did not see anything significant.    Past Medical History:  Diagnosis Date   DM (diabetes mellitus) ([place])    Hyperlipidemia    Hypertension    IBS (irritable bowel syndrome)    Macular degeneration (senile) of retina    Primary open angle glaucoma of both eyes    Past Surgical History:  Procedure Laterality Date   ROTATOR CUFF REPAIR     TUBAL LIGATION     Social History   Socioeconomic History   Marital status: Divorced    Spouse name: Not on file   Number of children: Not on file   Years of education: Not on file   Highest education level: Not on file  Occupational History   Not on file  Tobacco Use   Smoking status: Former    Types: Cigarettes    Quit date: 05/20/2009    Years since quitting: 12.3   Smokeless tobacco: Never  Substance and Sexual Activity   Alcohol use: Yes    Alcohol/week: 1.0 standard drink    Types: 1 Glasses of wine per week   Drug use: No   Sexual activity: Not on file  Other Topics Concern   Not on file  Social History Narrative   Not on file   Social Determinants  of Health   Financial Resource Strain: Not on file  Food Insecurity: Not on file  Transportation Needs: Not on file  Physical Activity: Not on file  Stress: Not on file  Social Connections: Not on file   Allergies  Allergen Reactions   Codeine Nausea And Vomiting   Cortisone     Swollen foot   Prednisone     Swollen shut eyes   Family History  Problem Relation Age of Onset   Alzheimer's disease Mother    Heart disease Father        valve replacement   Hypertension Sister    Hyperlipidemia Sister    Dementia Sister      Current Outpatient Medications (Cardiovascular):    amLODipine  (NORVASC) 10 MG tablet, Take 1 tablet (10 mg total) by mouth daily.  Current Outpatient Medications (Respiratory):    cetirizine (ZYRTEC) 10 MG tablet, Take 10 mg by mouth daily as needed for allergies.    Current Outpatient Medications (Other):    ALPRAZolam (NIRAVAM) 0.5 MG dissolvable tablet, Take 0.5 mg by mouth at bedtime as needed for anxiety.   Bilberry, Vaccinium myrtillus, (BILBERRY PO), Take by mouth.   Biotin 2.5 MG TABS, Take by mouth.   gabapentin (NEURONTIN) 100 MG capsule, Take 100 mg by mouth at bedtime as needed.   Multiple Vitamin (MULTIVITAMIN) tablet, Take 1 tablet by mouth daily.   tiZANidine (ZANAFLEX) 2 MG tablet, Take 1 tablet (2 mg total) by mouth at bedtime.   Reviewed prior external information including notes and imaging from  primary care provider As well as notes that were available from care everywhere and other healthcare systems.  Past medical history, social, surgical and family history all reviewed in electronic medical record.  No pertanent information unless stated regarding to the chief complaint.   Review of Systems:  No headache, visual changes, nausea, vomiting, diarrhea, constipation, dizziness, abdominal pain, skin rash, fevers, chills, night sweats, weight loss, swollen lymph nodes, joint swelling, chest pain, shortness of breath, mood changes. POSITIVE muscle aches, body aches  Objective  Blood pressure 118/74, pulse 81, height 5\' 2"  (1.575 m), weight 157 lb (71.2 kg), SpO2 98 %.   General: No apparent distress alert and oriented x3 mood and affect normal, dressed appropriately.  HEENT: Pupils equal, extraocular movements intact  Respiratory: Patient's speak in full sentences and does not appear short of breath  Cardiovascular: No lower extremity edema,  Gait severely antalgic Patient is well-appearing does not have any significant swelling.  Pain over the lateral gastrocnemius as well as the lateral anterior tibialis.  Pain is out of  proportion to the amount of palpation. Patient does have what appears to be a positive straight leg test.  Seems to be at 20 degrees of forward flexion.  Mild radicular type symptoms up to the buttocks area and down to near ankle.  No significant tenderness over the foot at this point.    Impression and Recommendations:     The above documentation has been reviewed and is accurate and complete Bianca Pulley, DO

## 2021-10-05 ENCOUNTER — Encounter: Payer: Self-pay | Admitting: Neurology

## 2021-10-05 ENCOUNTER — Other Ambulatory Visit: Payer: Self-pay

## 2021-10-05 ENCOUNTER — Ambulatory Visit: Payer: Medicare HMO | Admitting: Family Medicine

## 2021-10-05 ENCOUNTER — Ambulatory Visit: Payer: Self-pay

## 2021-10-05 VITALS — BP 118/74 | HR 81 | Ht 62.0 in | Wt 157.0 lb

## 2021-10-05 DIAGNOSIS — M79605 Pain in left leg: Secondary | ICD-10-CM

## 2021-10-05 DIAGNOSIS — M25572 Pain in left ankle and joints of left foot: Secondary | ICD-10-CM

## 2021-10-05 DIAGNOSIS — G8929 Other chronic pain: Secondary | ICD-10-CM | POA: Diagnosis not present

## 2021-10-05 DIAGNOSIS — R202 Paresthesia of skin: Secondary | ICD-10-CM

## 2021-10-05 NOTE — Assessment & Plan Note (Signed)
Patient continues to have left lower leg pain.  Discussed with patient at great length.  Patient states that the pain is severe.  Feels like this pain was there before she had back pain.  Patient does have an MRI of the lumbar spine showing bilateral L5 nerve root compression that I do think could be contributing to the pain.  Patient is adamant though that this seems to be more associated with the leg itself.  Doppler was negative for any type of blood clot.  No masses appreciated today.  At this point I would like to get a MRI of the tib-fib as well as an MRI of the knee to further evaluate if any type of compression could be happening in this area.  At the same time we will get a nerve conduction study to further evaluate.  I still feel that lumbar radiculopathy is high on the differential but patient is adamant that this does not feel like the nerve pain she had on the contralateral side.  Discussed with patient that she does have a positive straight leg test today.  Likely no other significant weakness noted.  Depending on how patient responds to the imaging and then treatment options we will have patient follow-up again in 4 weeks

## 2021-10-05 NOTE — Patient Instructions (Addendum)
[place] [phone] Call Today  When we receive your results we will contact you.  Referral to Neurology Pain gets significantly worse seek medical attention Still need to consider back as the problem See you again in 4 weeks

## 2021-10-13 DIAGNOSIS — I10 Essential (primary) hypertension: Secondary | ICD-10-CM | POA: Diagnosis not present

## 2021-10-13 DIAGNOSIS — E1169 Type 2 diabetes mellitus with other specified complication: Secondary | ICD-10-CM | POA: Diagnosis not present

## 2021-10-13 DIAGNOSIS — F419 Anxiety disorder, unspecified: Secondary | ICD-10-CM | POA: Diagnosis not present

## 2021-10-13 DIAGNOSIS — M79606 Pain in leg, unspecified: Secondary | ICD-10-CM | POA: Diagnosis not present

## 2021-10-16 ENCOUNTER — Other Ambulatory Visit: Payer: Self-pay

## 2021-10-16 ENCOUNTER — Ambulatory Visit
Admission: RE | Admit: 2021-10-16 | Discharge: 2021-10-16 | Disposition: A | Discharge status: Home / Self Care | Payer: Medicare HMO | Source: Ambulatory Visit | Attending: Family Medicine | Admitting: Family Medicine

## 2021-10-16 DIAGNOSIS — M79605 Pain in left leg: Secondary | ICD-10-CM

## 2021-10-16 DIAGNOSIS — S83282A Other tear of lateral meniscus, current injury, left knee, initial encounter: Secondary | ICD-10-CM | POA: Diagnosis not present

## 2021-10-16 IMAGING — MR MR KNEE*L* W/O CM
4 of 6 series · 21 of 40 positions shown · non-contrast
Comparison: X-ray knee [DATE]

CLINICAL DATA: Left posterior knee pain with left lateral leg pain
and cramping related to twisting foot 6 months ago.

EXAM:
MRI OF THE LEFT KNEE WITHOUT CONTRAST
TECHNIQUE: Multiplanar, multisequence MR imaging of the knee was performed. No
intravenous contrast was administered.

[Series 3: T2 fat-sat · axial · 4.0mm · 0.50mm/px · z∈[-24,+71]mm · 3 of 24 slices shown (1 of 2)]
[im 5/24]
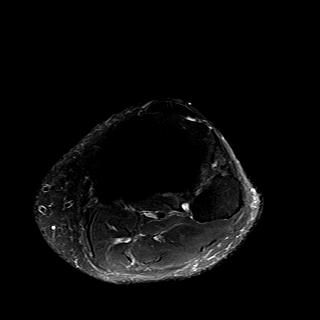
[im 14/24]
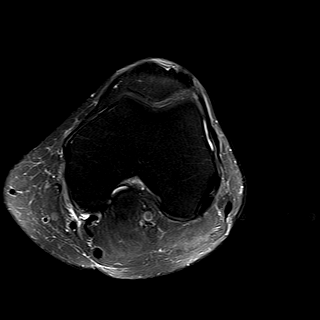
[im 24/24]
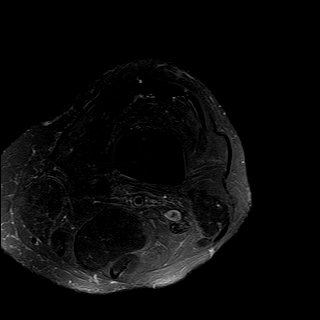

[Series 5: T2 fat-sat · coronal · 4.0mm · 0.29mm/px · 3 of 26 slices shown (2 of 2)]
[im 6/26]
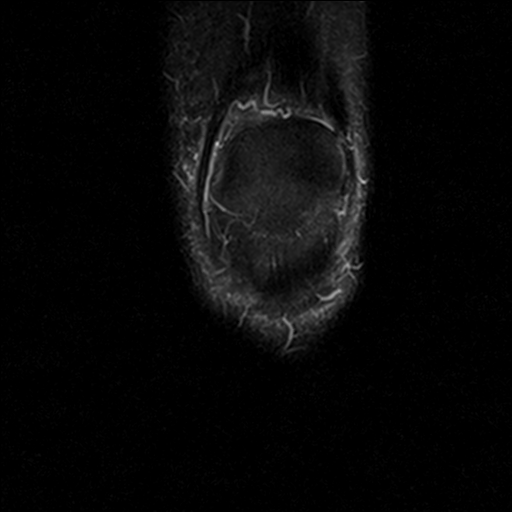
[im 16/26]
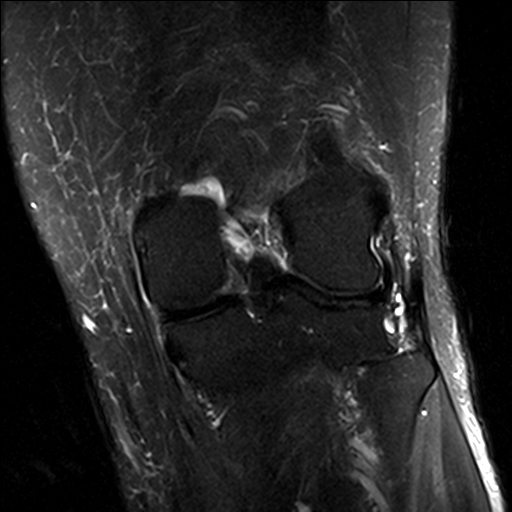
[im 26/26]
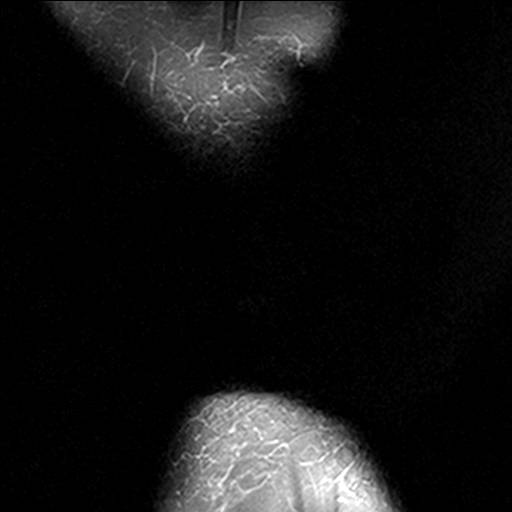

[Series 6: PD fat-sat · sagittal · 3.0mm · 0.29mm/px · 7 of 28 slices shown (1 of 2)]
[im 1/28]
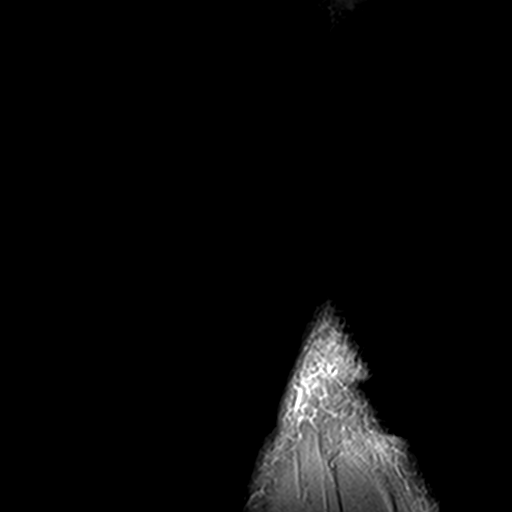
[im 5/28]
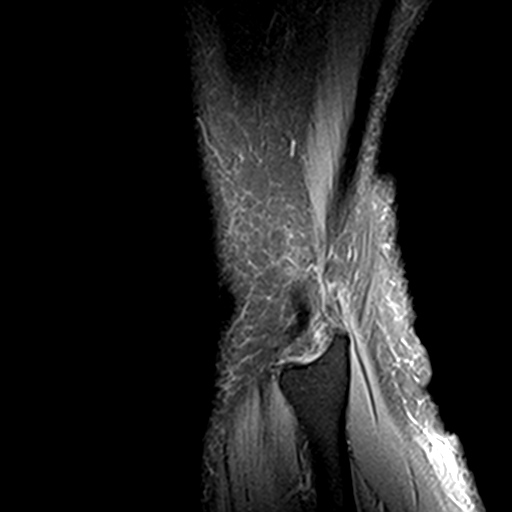
[im 10/28]
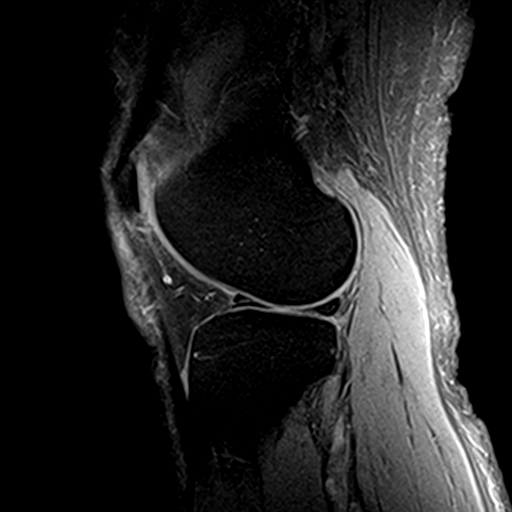
[im 14/28]
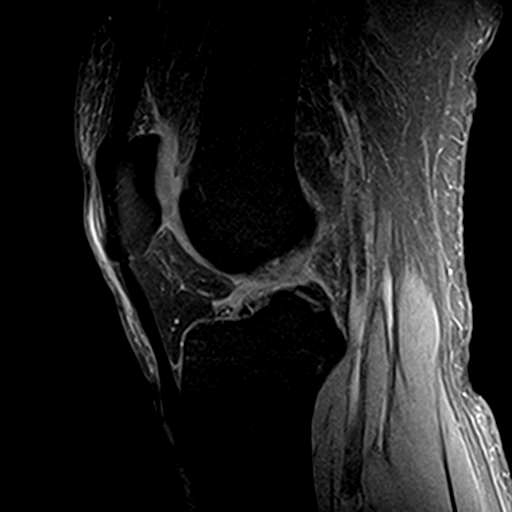
[im 19/28]
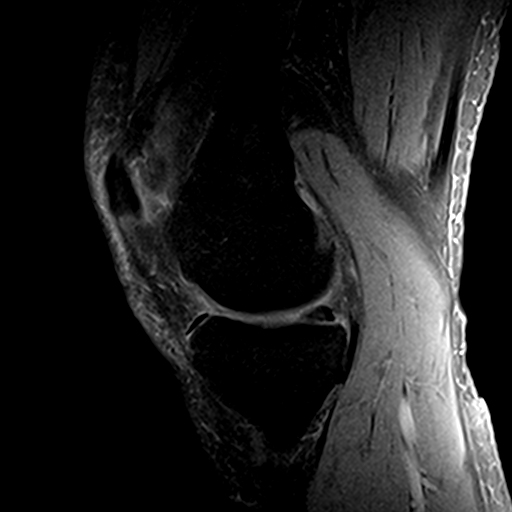
[im 23/28]
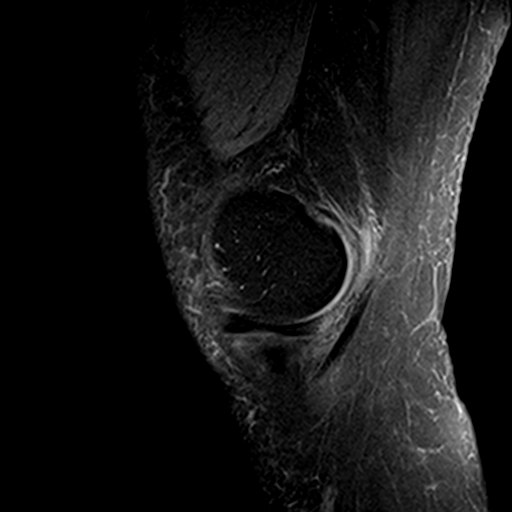
[im 28/28]
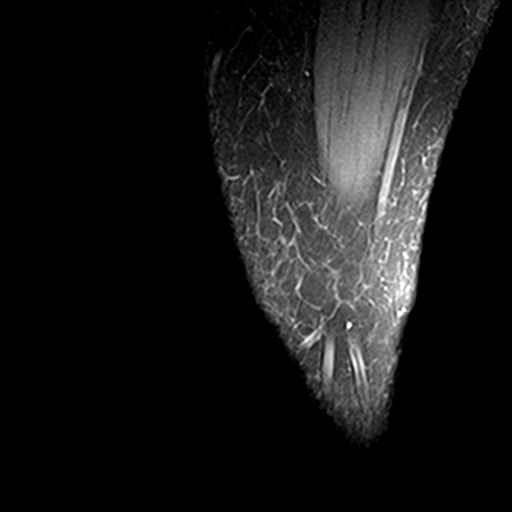

[Series 7: PD fat-sat · coronal · 3.0mm · 0.29mm/px · 8 of 32 slices shown (2 of 2)]
[im 1/32]
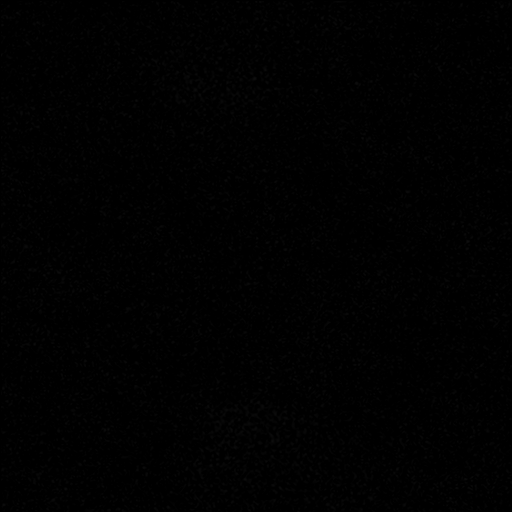
[im 5/32]
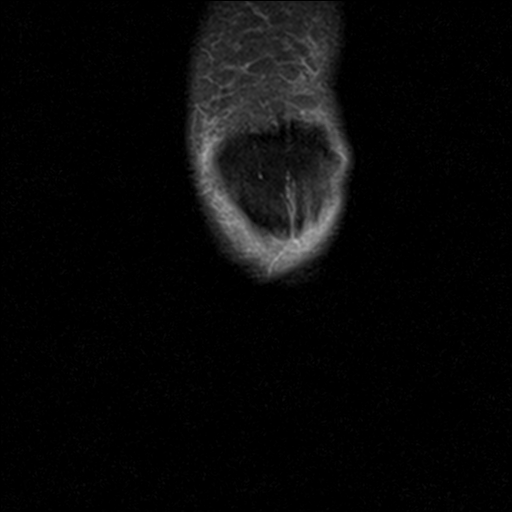
[im 9/32]
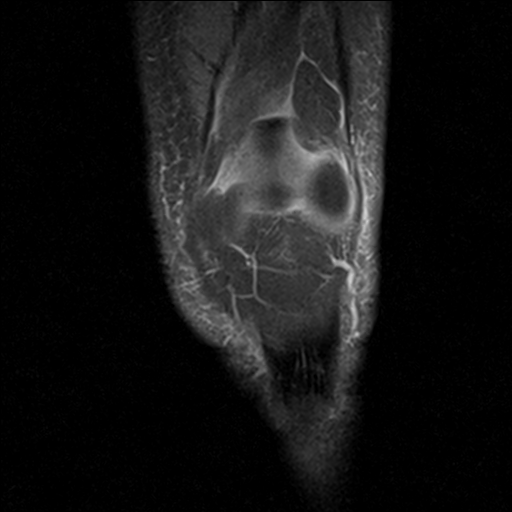
[im 14/32]
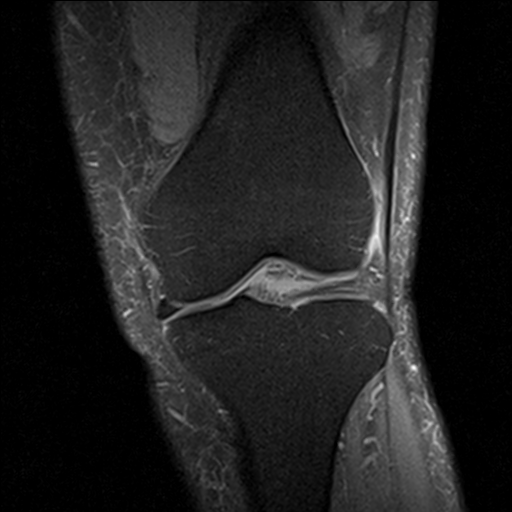
[im 18/32]
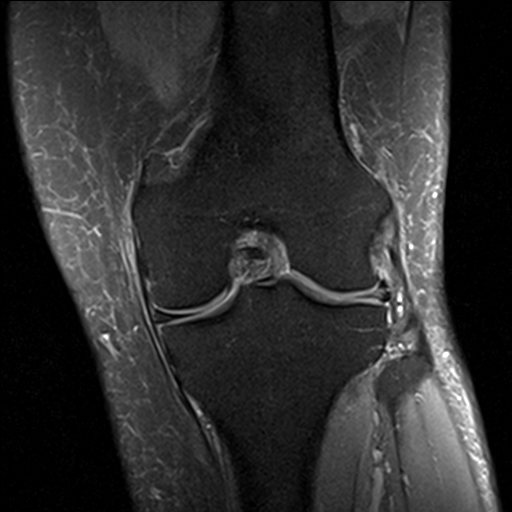
[im 23/32]
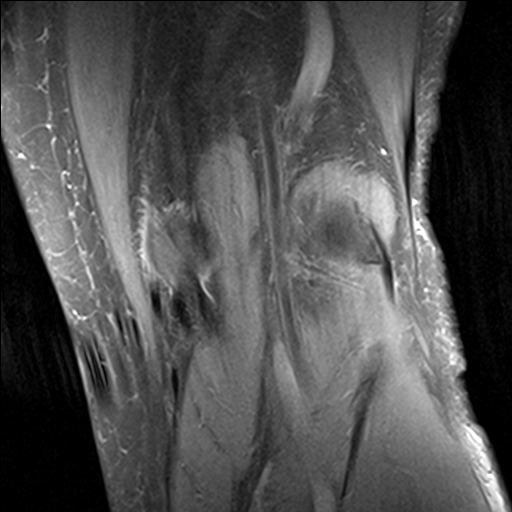
[im 27/32]
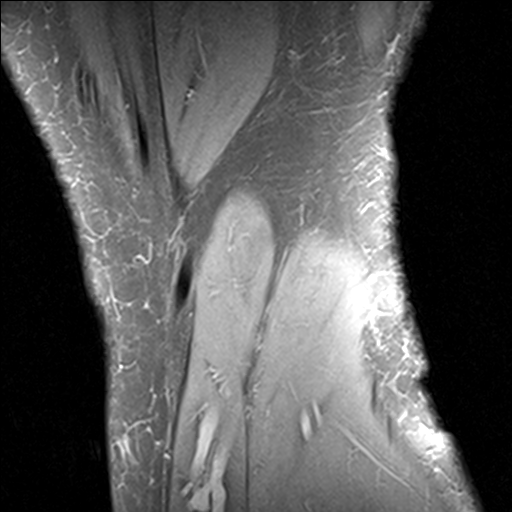
[im 32/32]
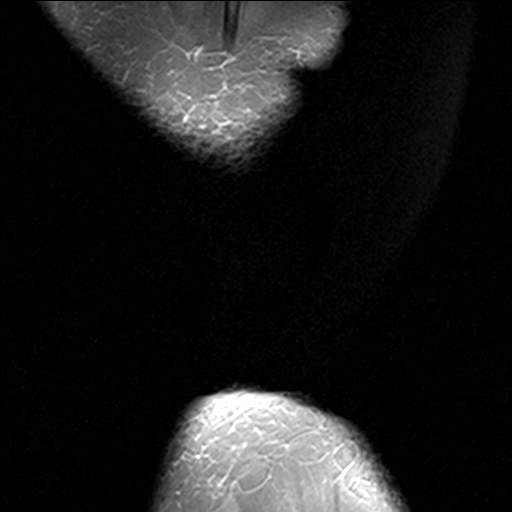

[21 of 40 positions shown; findings below may reference images not displayed]

FINDINGS: MENISCI

Medial meniscus: Intact. Intrasubstance degenerative type signal
changes but no discrete tear.

Lateral meniscus: Inferior articular surface tear involving the
posterior horn and superior articular surface tear involving the
anterior horn and midbody junction region.

LIGAMENTS

Cruciates:  He intact

Collaterals:  Intact

CARTILAGE

Patellofemoral:  Normal

Medial:  Minimal degenerative chondrosis for age.

Lateral:  Mild degenerative chondrosis.

Joint:  No joint effusion.

Popliteal Fossa:  No popliteal mass or Baker's cyst.

Extensor Mechanism: The patella retinacular structures are intact
and the quadriceps and patellar tendons are intact.

Bones:  No acute bony findings.

Other: Unremarkable knee musculature.
IMPRESSION: 1. Lateral meniscal tears as described above.
2. Intact ligamentous structures and no acute bony findings.
3. No joint effusion or Baker's cyst.

## 2021-10-16 IMAGING — MR MR [PERSON_NAME] LOW W/O CM*L*
4 of 5 series · 21 of 40 positions shown · non-contrast
Comparison: Radiographs [DATE]

CLINICAL DATA: Left leg pain and cramping. Twisting injury 6 months
ago.

EXAM:
MRI OF LOWER LEFT EXTREMITY WITHOUT CONTRAST
TECHNIQUE: Multiplanar, multisequence MR imaging of the left lower extremity
was performed. No intravenous contrast was administered.

[Series 6: T1 · coronal · 4.0mm · 0.70mm/px · 4 of 25 slices shown (1 of 2)]
[im 1/25]
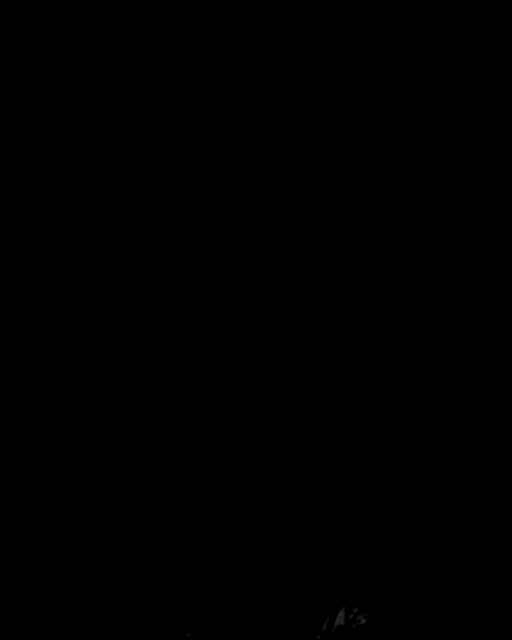
[im 9/25]
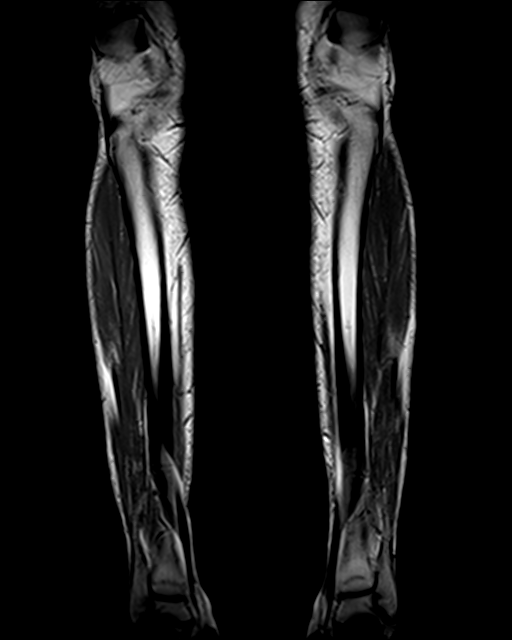
[im 17/25]
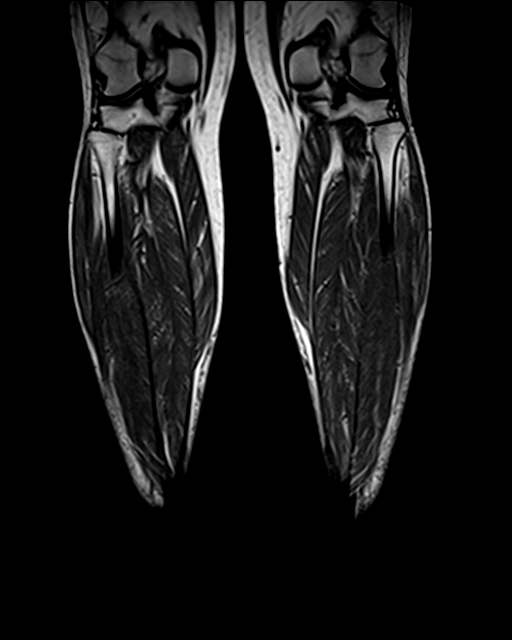
[im 25/25]
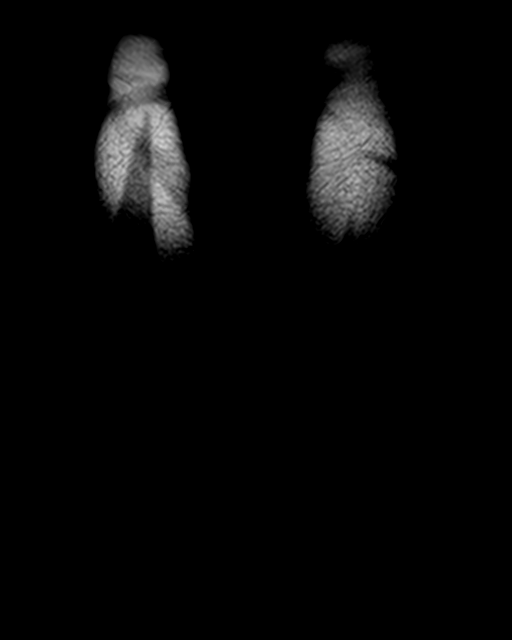

[Series 7: STIR · coronal · 4.0mm · 1.41mm/px · 3 of 25 slices shown]
[im 1/25]
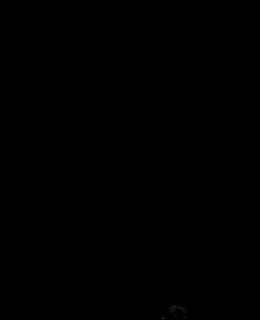
[im 13/25]
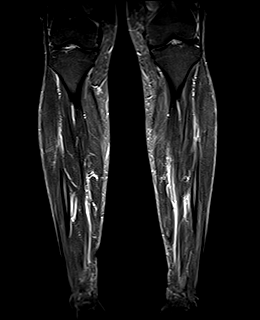
[im 25/25]
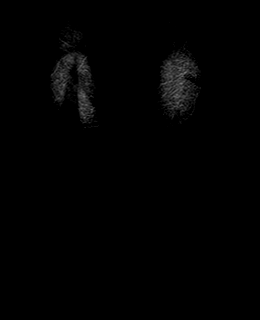

[Series 8: T2 fat-sat · axial · 5.0mm · 0.35mm/px · z∈[-166,+227]mm · 9 of 64 slices shown]
[im 1/64]
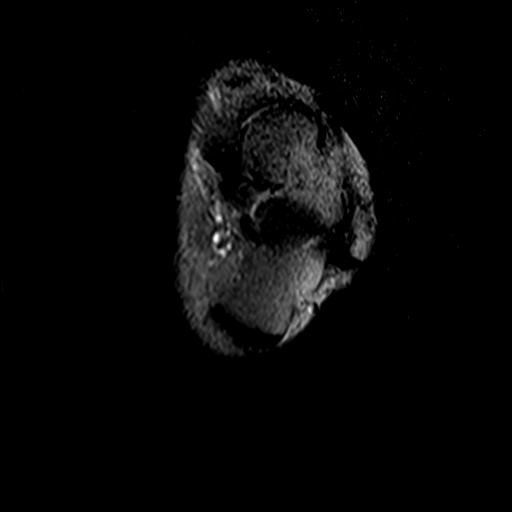
[im 11/64]
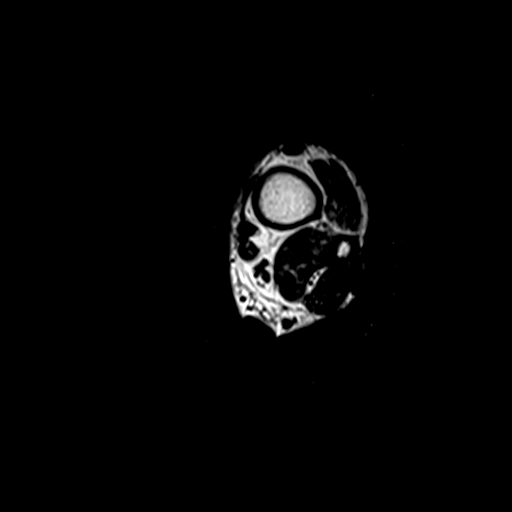
[im 22/64]
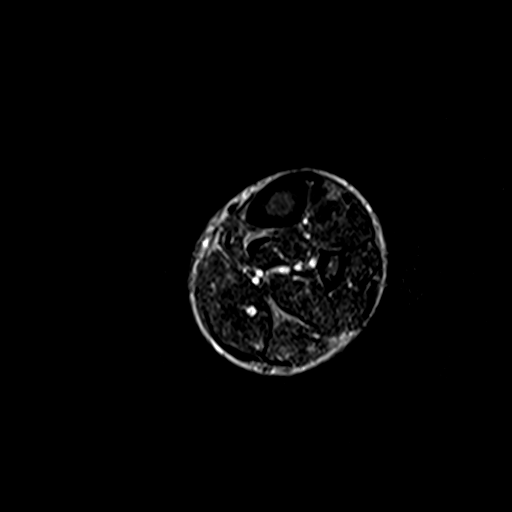
[im 27/64]
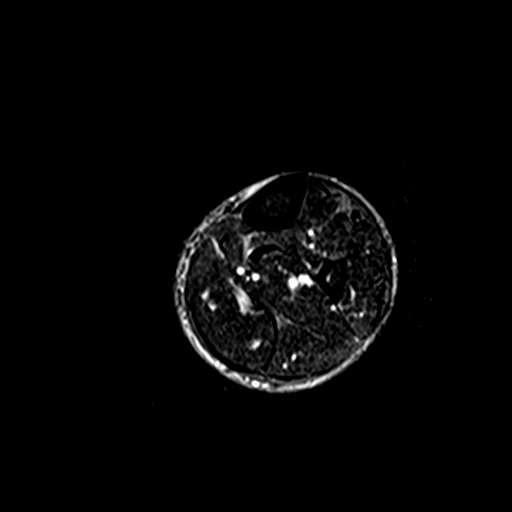
[im 32/64]
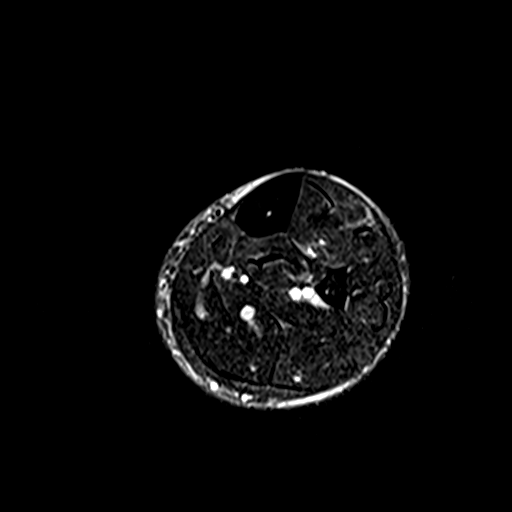
[im 37/64]
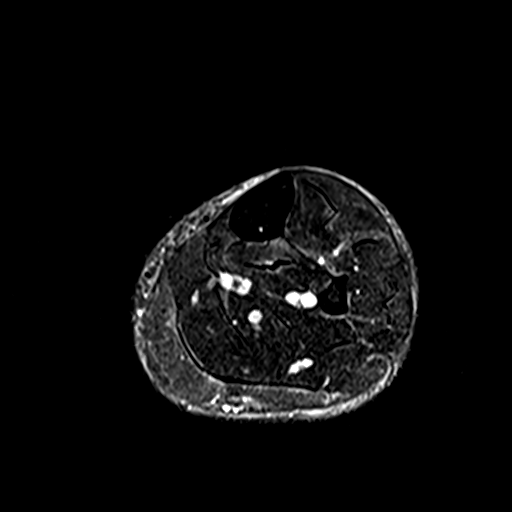
[im 43/64]
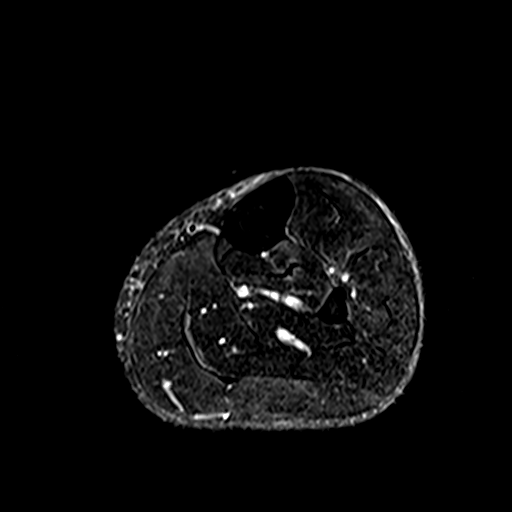
[im 53/64]
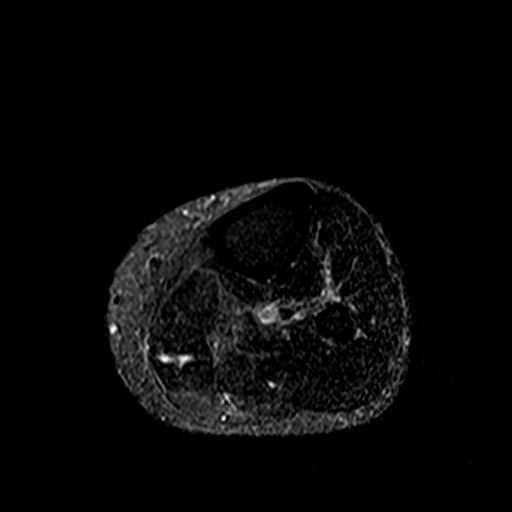
[im 64/64]
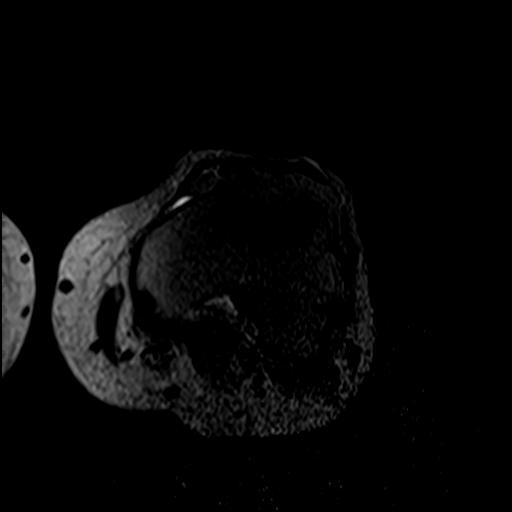

[Series 9: T1 · axial · 5.0mm · 0.35mm/px · z∈[-166,+159]mm · 5 of 64 slices shown (2 of 2)]
[im 1/64]
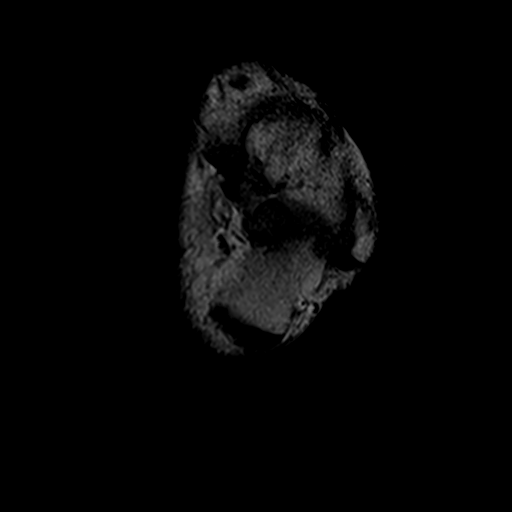
[im 11/64]
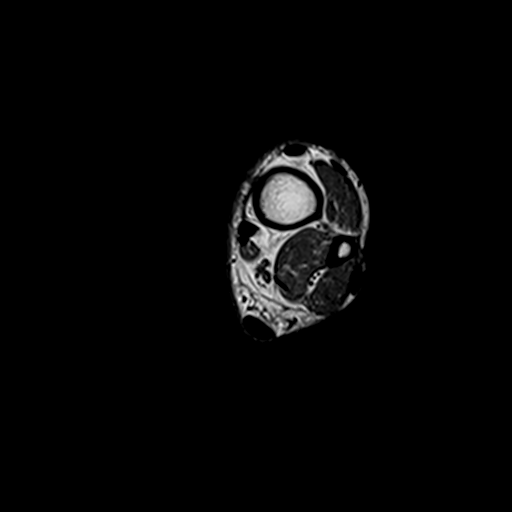
[im 22/64]
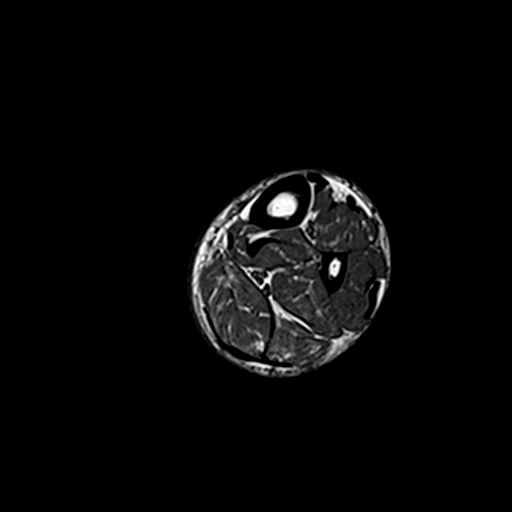
[im 32/64]
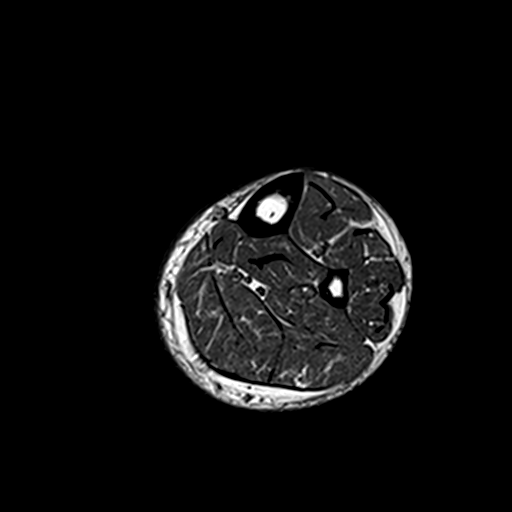
[im 53/64]
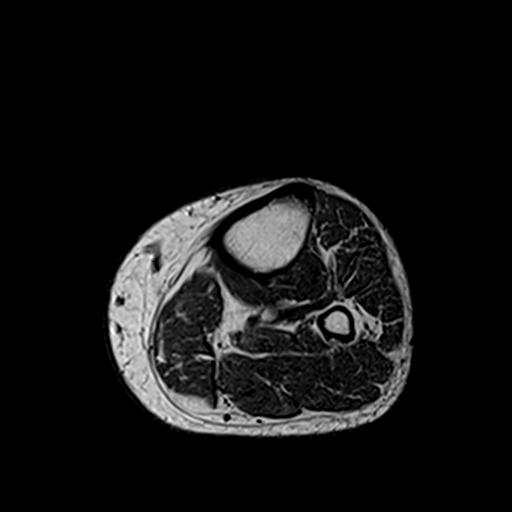

[21 of 40 positions shown; findings below may reference images not displayed]

FINDINGS: The knee and ankle joints are unremarkable. No stress fracture or
bone lesion involving the tibia or fibula.

No muscle tear or intramuscular hematoma. No muscle mass or fatty
atrophy. The Achilles tendon is intact.
IMPRESSION: Unremarkable MR examination of the left lower extremity.

## 2021-10-30 NOTE — Progress Notes (Signed)
[place] [place] [address] [place] Phone: [phone] Subjective:   Bianca Foster, am serving as a scribe for Dr. Hulan Saas. This visit occurred during the SARS-CoV-2 public health emergency.  Safety protocols were in place, including screening questions prior to the visit, additional usage of staff PPE, and extensive cleaning of exam room while observing appropriate contact time as indicated for disinfecting solutions.   I'm seeing this patient by the request  of:  Maurice Small, MD  CC: Left leg pain follow-up  [id number]  10/05/2021 Patient continues to have left lower leg pain.  Discussed with patient at great length.  Patient states that the pain is severe.  Feels like this pain was there before she had back pain.  Patient does have an MRI of the lumbar spine showing bilateral L5 nerve root compression that I do think could be contributing to the pain.  Patient is adamant though that this seems to be more associated with the leg itself.  Doppler was negative for any type of blood clot.  No masses appreciated today.  At this point I would like to get a MRI of the tib-fib as well as an MRI of the knee to further evaluate if any type of compression could be happening in this area.  At the same time we will get a nerve conduction study to further evaluate.  I still feel that lumbar radiculopathy is high on the differential but patient is adamant that this does not feel like the nerve pain she had on the contralateral side.  Discussed with patient that she does have a positive straight leg test today.  Likely no other significant weakness noted.  Depending on how patient responds to the imaging and then treatment options we will have patient follow-up again in 4 weeks  Update 11/02/2021 Bianca Foster is a 71 y.o. female coming in with complaint of L ankle and leg pain. Patient states doing better today. Pain is still there, but depends on how much  she's on the leg. Has to adjust her schedule. Wondering if a chiropractor would be better. Epidural for the left side?      Past Medical History:  Diagnosis Date   DM (diabetes mellitus) ([place])    Hyperlipidemia    Hypertension    IBS (irritable bowel syndrome)    Macular degeneration (senile) of retina    Primary open angle glaucoma of both eyes    Past Surgical History:  Procedure Laterality Date   ROTATOR CUFF REPAIR     TUBAL LIGATION     Social History   Socioeconomic History   Marital status: Divorced    Spouse name: Not on file   Number of children: Not on file   Years of education: Not on file   Highest education level: Not on file  Occupational History   Not on file  Tobacco Use   Smoking status: Former    Types: Cigarettes    Quit date: 05/20/2009    Years since quitting: 12.4   Smokeless tobacco: Never  Substance and Sexual Activity   Alcohol use: Yes    Alcohol/week: 1.0 standard drink    Types: 1 Glasses of wine per week   Drug use: No   Sexual activity: Not on file  Other Topics Concern   Not on file  Social History Narrative   Not on file   Social Determinants of Health   Financial Resource Strain: Not on file  Food Insecurity: Not on file  Transportation Needs: Not on file  Physical Activity: Not on file  Stress: Not on file  Social Connections: Not on file   Allergies  Allergen Reactions   Codeine Nausea And Vomiting   Cortisone     Swollen foot   Prednisone     Swollen shut eyes   Family History  Problem Relation Age of Onset   Alzheimer's disease Mother    Heart disease Father        valve replacement   Hypertension Sister    Hyperlipidemia Sister    Dementia Sister      Current Outpatient Medications (Cardiovascular):    amLODipine (NORVASC) 10 MG tablet, Take 1 tablet (10 mg total) by mouth daily.  Current Outpatient Medications (Respiratory):    cetirizine (ZYRTEC) 10 MG tablet, Take 10 mg by mouth daily as needed for  allergies.    Current Outpatient Medications (Other):    ALPRAZolam (NIRAVAM) 0.5 MG dissolvable tablet, Take 0.5 mg by mouth at bedtime as needed for anxiety.   Bilberry, Vaccinium myrtillus, (BILBERRY PO), Take by mouth.   Biotin 2.5 MG TABS, Take by mouth.   gabapentin (NEURONTIN) 100 MG capsule, Take 100 mg by mouth at bedtime as needed.   Multiple Vitamin (MULTIVITAMIN) tablet, Take 1 tablet by mouth daily.   tiZANidine (ZANAFLEX) 2 MG tablet, Take 1 tablet (2 mg total) by mouth at bedtime.   Reviewed prior external information including notes and imaging from  primary care provider As well as notes that were available from care everywhere and other healthcare systems.  Past medical history, social, surgical and family history all reviewed in electronic medical record.  No pertanent information unless stated regarding to the chief complaint.   Review of Systems:  No headache, visual changes, nausea, vomiting, diarrhea, constipation, dizziness, abdominal pain, skin rash, fevers, chills, night sweats, weight loss, swollen lymph nodes, joint swelling, chest pain, shortness of breath, mood changes. POSITIVE muscle aches, body aches  Objective  Blood pressure 118/72, pulse 72, height 5\' 2"  (1.575 m), weight 155 lb (70.3 kg), SpO2 99 %.   General: No apparent distress alert and oriented x3 mood and affect normal, dressed appropriately.  HEENT: Pupils equal, extraocular movements intact  Respiratory: Patient's speak in full sentences and does not appear short of breath  Cardiovascular: No lower extremity edema, non tender, no erythema  Gait normal with good balance and coordination.  MSK: Left knee exam still has tenderness to palpation over the lateral area.  Mild positive McMurray's.  Pain still over the anterior lateral aspect of the calf.  Seems to be out of proportion. Patient back exam tender to palpation.  Loss of lordosis.  Questionable increase in tightness with straight leg  test but does not know if there is true radicular symptoms with patient continuing to have pain.    Impression and Recommendations:    The above documentation has been reviewed and is accurate and complete Bianca Pulley, DO

## 2021-11-02 ENCOUNTER — Ambulatory Visit: Payer: Medicare HMO | Admitting: Family Medicine

## 2021-11-02 ENCOUNTER — Other Ambulatory Visit: Payer: Self-pay

## 2021-11-02 ENCOUNTER — Encounter: Payer: Self-pay | Admitting: Family Medicine

## 2021-11-02 VITALS — BP 118/72 | HR 72 | Ht 62.0 in | Wt 155.0 lb

## 2021-11-02 DIAGNOSIS — M79605 Pain in left leg: Secondary | ICD-10-CM

## 2021-11-02 DIAGNOSIS — G8929 Other chronic pain: Secondary | ICD-10-CM | POA: Diagnosis not present

## 2021-11-02 DIAGNOSIS — M5441 Lumbago with sciatica, right side: Secondary | ICD-10-CM

## 2021-11-02 DIAGNOSIS — M23301 Other meniscus derangements, unspecified lateral meniscus, left knee: Secondary | ICD-10-CM

## 2021-11-02 NOTE — Patient Instructions (Addendum)
Epidural ordered schedule 2-3 weeks out Knee injection today If knee injection works, don't need to do the epidural. See you again in 4-6 weeks

## 2021-11-02 NOTE — Assessment & Plan Note (Signed)
Chronic with spinal stenosis.  Patient is going to have the order of the L4-L5 epidural with patient's MRI showing that there is a nerve root impingement that could be contributing.  This would be consistent with some of patient's lateral knee pain.  Once again see how patient responds to the knee injection with the lateral meniscus tear noted.  Follow-up again in 4 to 6 weeks

## 2021-11-02 NOTE — Assessment & Plan Note (Addendum)
Patient given injection and tolerated the procedure well, discussed icing regimen and home exercises, discussed which activities to do which ones to avoid.  Increase activity slowly.  Follow-up again in 6 to 8 weeks concerned now that the pain could be secondary to the lumbar radiculopathy and will also order a epidural of the back if this does not seem to make a difference.  Patient wants to avoid any type of surgery but we discussed that this may be necessary if patient continues to have pain

## 2021-11-05 ENCOUNTER — Encounter: Payer: Medicare HMO | Admitting: Neurology

## 2021-11-10 ENCOUNTER — Encounter: Payer: Medicare HMO | Admitting: Neurology

## 2021-11-27 NOTE — Progress Notes (Unsigned)
[place] Phone: [phone] Subjective:   [name], am serving as a scribe for Dr. Hulan Saas.  This visit occurred during the SARS-CoV-2 public health emergency.  Safety protocols were in place, including screening questions prior to the visit, additional usage of staff PPE, and extensive cleaning of exam room while observing appropriate contact time as indicated for disinfecting solutions.    I'm seeing this patient by the request  of:  Maurice Small, MD  CC:   [id number]  11/02/2021 Chronic with spinal stenosis.  Patient is going to have the order of the L4-L5 epidural with patient's MRI showing that there is a nerve root impingement that could be contributing.  This would be consistent with some of patient's lateral knee pain.  Once again see how patient responds to the knee injection with the lateral meniscus tear noted.  Follow-up again in 4 to 6 weeks  Patient given injection and tolerated the procedure well, discussed icing regimen and home exercises, discussed which activities to do which ones to avoid.  Increase activity slowly.  Follow-up again in 6 to 8 weeks concerned now that the pain could be secondary to the lumbar radiculopathy and will also order a epidural of the back if this does not seem to make a difference.  Patient wants to avoid any type of surgery but we discussed that this may be necessary if patient continues to have pain  Update 11/30/2021 Bianca Foster is a 71 y.o. female coming in with complaint of L knee and R sided LBP. Patient states that she continues to have pain and cramping in lower leg and pain in posterior aspect of knee. Injection did calm down pain but she continues to have intermittent unpredictable symptoms. More pain with standing up all day. Has appt for EMG on 12/14/2021. Wants to know if she should keep this or what next step should be.       Past Medical  History:  Diagnosis Date   DM (diabetes mellitus) ([facility])    Hyperlipidemia    Hypertension    IBS (irritable bowel syndrome)    Macular degeneration (senile) of retina    Primary open angle glaucoma of both eyes    Past Surgical History:  Procedure Laterality Date   ROTATOR CUFF REPAIR     TUBAL LIGATION     Social History   Socioeconomic History   Marital status: Divorced    Spouse name: Not on file   Number of children: Not on file   Years of education: Not on file   Highest education level: Not on file  Occupational History   Not on file  Tobacco Use   Smoking status: Former    Types: Cigarettes    Quit date: 05/20/2009    Years since quitting: 12.5   Smokeless tobacco: Never  Substance and Sexual Activity   Alcohol use: Yes    Alcohol/week: 1.0 standard drink    Types: 1 Glasses of wine per week   Drug use: No   Sexual activity: Not on file  Other Topics Concern   Not on file  Social History Narrative   Not on file   Social Determinants of Health   Financial Resource Strain: Not on file  Food Insecurity: Not on file  Transportation Needs: Not on file  Physical Activity: Not on file  Stress: Not on file  Social Connections: Not on file   Allergies  Allergen  Reactions   Codeine Nausea And Vomiting   Cortisone     Swollen foot   Prednisone     Swollen shut eyes   Family History  Problem Relation Age of Onset   Alzheimer's disease Mother    Heart disease Father        valve replacement   Hypertension Sister    Hyperlipidemia Sister    Dementia Sister      Current Outpatient Medications (Cardiovascular):    amLODipine (NORVASC) 10 MG tablet, Take 1 tablet (10 mg total) by mouth daily.  Current Outpatient Medications (Respiratory):    cetirizine (ZYRTEC) 10 MG tablet, Take 10 mg by mouth daily as needed for allergies.    Current Outpatient Medications (Other):    ALPRAZolam (NIRAVAM) 0.5 MG dissolvable tablet, Take 0.5 mg by mouth at bedtime  as needed for anxiety.   Bilberry, Vaccinium myrtillus, (BILBERRY PO), Take by mouth.   Biotin 2.5 MG TABS, Take by mouth.   gabapentin (NEURONTIN) 100 MG capsule, Take 100 mg by mouth at bedtime as needed.   Multiple Vitamin (MULTIVITAMIN) tablet, Take 1 tablet by mouth daily.   tiZANidine (ZANAFLEX) 2 MG tablet, Take 1 tablet (2 mg total) by mouth at bedtime.   Reviewed prior external information including notes and imaging from  primary care provider As well as notes that were available from care everywhere and other healthcare systems.  Past medical history, social, surgical and family history all reviewed in electronic medical record.  No pertanent information unless stated regarding to the chief complaint.   Review of Systems:  No headache, visual changes, nausea, vomiting, diarrhea, constipation, dizziness, abdominal pain, skin rash, fevers, chills, night sweats, weight loss, swollen lymph nodes, body aches, joint swelling, chest pain, shortness of breath, mood changes. POSITIVE muscle aches  Objective  There were no vitals taken for this visit.   General: No apparent distress alert and oriented x3 mood and affect normal, dressed appropriately.  HEENT: Pupils equal, extraocular movements intact  Respiratory: Patient's speak in full sentences and does not appear short of breath  Cardiovascular: No lower extremity edema, non tender, no erythema  Gait normal with good balance and coordination.  MSK:  Non tender with full range of motion and good stability and symmetric strength and tone of shoulders, elbows, wrist, hip, knee and ankles bilaterally.     Impression and Recommendations:     The above documentation has been reviewed and is accurate and complete Jacqualin Combes

## 2021-11-30 ENCOUNTER — Encounter: Payer: Self-pay | Admitting: Family Medicine

## 2021-11-30 ENCOUNTER — Ambulatory Visit: Payer: Medicare HMO | Admitting: Family Medicine

## 2021-11-30 ENCOUNTER — Other Ambulatory Visit: Payer: Self-pay

## 2021-11-30 DIAGNOSIS — M23301 Other meniscus derangements, unspecified lateral meniscus, left knee: Secondary | ICD-10-CM

## 2021-11-30 DIAGNOSIS — M79605 Pain in left leg: Secondary | ICD-10-CM

## 2021-11-30 NOTE — Assessment & Plan Note (Signed)
Patient feels like the left leg pain is improved since patient did have the injection in the knee.  Patient though is still having the pain.  Do feel that I would like patient to go through with a nerve conduction study to further evaluate if any lumbar radiculopathy would be potentially playing a role with the severity of the arthritic changes and spinal stenosis patient has.  Patient is in agreement with the plan and will follow-up again in 4 to 6 weeks.  Regarding her knees she could also be a possible candidate for viscosupplementation.  Patient will follow-up and we will see how patient is responding. ?

## 2021-11-30 NOTE — Assessment & Plan Note (Signed)
Patient is making some improvement.  If continuing to have difficulty we can consider the possibility of gel injection.  If that does not seem to work then I would like patient to seek opportunity for surgical intervention. ?

## 2021-11-30 NOTE — Patient Instructions (Signed)
Approved for gel ?Get nerve conduction study ?See me in 4-5 weeks ?

## 2021-12-01 ENCOUNTER — Encounter: Payer: Self-pay | Admitting: Family Medicine

## 2021-12-15 ENCOUNTER — Ambulatory Visit: Payer: Medicare HMO | Admitting: Neurology

## 2021-12-15 ENCOUNTER — Other Ambulatory Visit: Payer: Self-pay

## 2021-12-15 DIAGNOSIS — R202 Paresthesia of skin: Secondary | ICD-10-CM

## 2021-12-15 NOTE — Procedures (Signed)
[facility]  ?[address] ? [place], [place] [address] ?Tel: [phone] ?Fax:  [phone] ?Test Date:  12/15/2021 ? ?Patient: Bianca Foster DOB: Sep 11, 1951 Physician: Narda Amber, DO  ?Sex: Female Height: '5\' 2"'$  Ref Phys: Hulan Saas, DO  ?ID#: [id number]   Technician:   ? ?Patient Complaints: ?This is a 71 year old female referred for evaluation of left leg pain. ? ?NCV & EMG Findings: ?Electrodiagnostic testing of the left lower extremity shows: ?Left sural and superficial peroneal sensory responses are within normal limits. ?Left peroneal and tibial motor responses are within normal limits. ?Left tibial H reflex study is within normal limits. ?There is no evidence of active or chronic motor axonal loss changes affecting any of the tested muscles.  Motor unit configuration and recruitment pattern is within normal limits. ? ? ?Impression: ?This is a normal study of the left lower extremity.  In particular, there is no evidence of a lumbosacral radiculopathy or sensorimotor polyneuropathy. ? ? ? ? ?___________________________ ?Narda Amber, DO ? ? ? ?Nerve Conduction Studies ?Anti Sensory Summary Table ? ? Stim Site NR Peak (ms) Norm Peak (ms) P-T Amp (?V) Norm P-T Amp  ?Left Sup Peroneal Anti Sensory (Ant Lat Mall)  32?C  ?12 cm    2.4 <4.6 9.6 >3  ?Left Sural Anti Sensory (Lat Mall)  32?C  ?Calf    2.4 <4.6 19.0 >3  ? ?Motor Summary Table ? ? Stim Site NR Onset (ms) Norm Onset (ms) O-P Amp (mV) Norm O-P Amp Site1 Site2 Delta-0 (ms) Dist (cm) Vel (m/s) Norm Vel (m/s)  ?Left Peroneal Motor (Ext Dig Brev)  32?C  ?Ankle    3.0 <6.0 6.7 >2.5 B Fib Ankle 6.8 35.0 51 >40  ?B Fib    9.8  6.6  Poplt B Fib 1.2 7.0 58 >40  ?Poplt    11.0  6.2         ?Left Tibial Motor ([place]d [place] Crane Brev)  32?C  ?Ankle    2.3 <6.0 6.2 >4 Knee Ankle 9.2 39.0 42 >40  ?Knee    11.5  6.2         ? ?H Reflex Studies ? ? NR H-Lat (ms) Lat Norm (ms) L-R H-Lat (ms)  ?Left Tibial (Gastroc)  32?C  ?   27.89 <35   ? ?EMG ? ?  Side Muscle Ins Act Fibs Psw Fasc Number Recrt Dur Dur. Amp Amp. Poly Poly. Comment  ?Left AntTibialis Nml Nml Nml Nml Nml Nml Nml Nml Nml Nml Nml Nml N/A  ?Left Gastroc Nml Nml Nml Nml Nml Nml Nml Nml Nml Nml Nml Nml N/A  ?Left Flex Dig Long Nml Nml Nml Nml Nml Nml Nml Nml Nml Nml Nml Nml N/A  ?Left RectFemoris Nml Nml Nml Nml Nml Nml Nml Nml Nml Nml Nml Nml N/A  ?Left GluteusMed Nml Nml Nml Nml Nml Nml Nml Nml Nml Nml Nml Nml N/A  ? ? ? ? ?Waveforms: ?    ? ?   ? ? ?

## 2021-12-28 NOTE — Progress Notes (Signed)
?Charlann Boxer D.O. ?[facility] ?[place] ?Phone: [phone] ?Subjective:   ?I, Bianca Foster, am serving as a scribe for Dr. Hulan Saas. ? ?I'm seeing this patient by the request  of:  Pa, [facility] ? ?CC: back and knee pain  ? ?[id number]  ?11/30/2021 ?Patient is making some improvement.  If continuing to have difficulty we can consider the possibility of gel injection.  If that does not seem to work then I would like patient to seek opportunity for surgical intervention. ? ?Patient feels like the left leg pain is improved since patient did have the injection in the knee.  Patient though is still having the pain.  Do feel that I would like patient to go through with a nerve conduction study to further evaluate if any lumbar radiculopathy would be potentially playing a role with the severity of the arthritic changes and spinal stenosis patient has.  Patient is in agreement with the plan and will follow-up again in 4 to 6 weeks.  Regarding her knees she could also be a possible candidate for viscosupplementation.  Patient will follow-up and we will see how patient is responding. ? ?Update 12/29/2021 ?Bianca Foster is a 71 y.o. female coming in with complaint of L knee pain. EMG on 12/15/2021. Approved for Monovisc. Patient states that the knee is doing better the pain is not as severe as it was. Patient states that the EMG stirred up trouble, swelling and pain came back. Patient states since the steroid injection it has calmed down, patient still cannot lay on the left side, still bothersome to to walk, and still tender behind the knee.  ? ?  ? ?Past Medical History:  ?Diagnosis Date  ? DM (diabetes mellitus) ([place])   ? Hyperlipidemia   ? Hypertension   ? IBS (irritable bowel syndrome)   ? Macular degeneration (senile) of retina   ? Primary open angle glaucoma of both eyes   ? ?Past Surgical History:  ?Procedure Laterality Date  ? ROTATOR CUFF  REPAIR    ? TUBAL LIGATION    ? ?Social History  ? ?Socioeconomic History  ? Marital status: Divorced  ?  Spouse name: Not on file  ? Number of children: Not on file  ? Years of education: Not on file  ? Highest education level: Not on file  ?Occupational History  ? Not on file  ?Tobacco Use  ? Smoking status: Former  ?  Types: Cigarettes  ?  Quit date: 05/20/2009  ?  Years since quitting: 12.6  ? Smokeless tobacco: Never  ?Substance and Sexual Activity  ? Alcohol use: Yes  ?  Alcohol/week: 1.0 standard drink  ?  Types: 1 Glasses of wine per week  ? Drug use: No  ? Sexual activity: Not on file  ?Other Topics Concern  ? Not on file  ?Social History Narrative  ? Not on file  ? ?Social Determinants of Health  ? ?Financial Resource Strain: Not on file  ?Food Insecurity: Not on file  ?Transportation Needs: Not on file  ?Physical Activity: Not on file  ?Stress: Not on file  ?Social Connections: Not on file  ? ?Allergies  ?Allergen Reactions  ? Codeine Nausea And Vomiting  ? Cortisone   ?  Swollen foot  ? Prednisone   ?  Swollen shut eyes  ? ?Family History  ?Problem Relation Age of Onset  ? Alzheimer's disease Mother   ? Heart disease Father   ?  valve replacement  ? Hypertension Sister   ? Hyperlipidemia Sister   ? Dementia Sister   ? ? ? ?Current Outpatient Medications (Cardiovascular):  ?  amLODipine (NORVASC) 10 MG tablet, Take 1 tablet (10 mg total) by mouth daily. ? ?Current Outpatient Medications (Respiratory):  ?  cetirizine (ZYRTEC) 10 MG tablet, Take 10 mg by mouth daily as needed for allergies. ? ? ? ?Current Outpatient Medications (Other):  ?  ALPRAZolam (NIRAVAM) 0.5 MG dissolvable tablet, Take 0.5 mg by mouth at bedtime as needed for anxiety. ?  Bilberry, Vaccinium myrtillus, (BILBERRY PO), Take by mouth. ?  Biotin 2.5 MG TABS, Take by mouth. ?  gabapentin (NEURONTIN) 100 MG capsule, Take 100 mg by mouth at bedtime as needed. ?  Multiple Vitamin (MULTIVITAMIN) tablet, Take 1 tablet by mouth daily. ?   tiZANidine (ZANAFLEX) 2 MG tablet, Take 1 tablet (2 mg total) by mouth at bedtime. ? ?Review of Systems: ? No headache, visual changes, nausea, vomiting, diarrhea, constipation, dizziness, abdominal pain, skin rash, fevers, chills, night sweats, weight loss, swollen lymph nodes, body aches, joint swelling, chest pain, shortness of breath, mood changes. POSITIVE muscle aches ? ?Objective  ?Blood pressure 110/68, pulse 68, height '5\' 2"'$  (1.575 m), weight 150 lb (68 kg), SpO2 99 %. ?  ?General: No apparent distress alert and oriented x3 mood and affect normal, dressed appropriately.  ?HEENT: Pupils equal, extraocular movements intact  ?Respiratory: Patient's speak in full sentences and does not appear short of breath  ?Cardiovascular: No lower extremity edema, non tender, no erythema  ?Gait normal with good balance and coordination.  ?MSK: Left knee exam shows the patient is tender to palpation on the lateral joint line.  Mild positive McMurray's.  Patient does have a decrease in the amount of effusion of the patellofemoral joint from previous exam. ?Back exam does have some mild loss of lordosis. ? ?  ?Impression and Recommendations:  ?  ? ?The above documentation has been reviewed and is accurate and complete [name], DO ? ? ? ?

## 2021-12-29 ENCOUNTER — Ambulatory Visit: Payer: Medicare HMO | Admitting: Family Medicine

## 2021-12-29 DIAGNOSIS — M5441 Lumbago with sciatica, right side: Secondary | ICD-10-CM | POA: Diagnosis not present

## 2021-12-29 DIAGNOSIS — G8929 Other chronic pain: Secondary | ICD-10-CM

## 2021-12-29 DIAGNOSIS — M23301 Other meniscus derangements, unspecified lateral meniscus, left knee: Secondary | ICD-10-CM

## 2021-12-29 NOTE — Assessment & Plan Note (Signed)
Patient does have a low back pain and it does seem to be stable.  Discussed icing regimen and home exercises.  Increase activity as tolerated.  Follow-up in 6 to 8 weeks. ?

## 2021-12-29 NOTE — Assessment & Plan Note (Signed)
Patient is improved at this time.  We did have him approval for the viscosupplementation but patient wants to avoid that at the moment with her doing relatively well.  We will continue to monitor.  Follow-up again in 6 to 8 weeks. ?

## 2021-12-29 NOTE — Patient Instructions (Addendum)
God to see you  ?Lets keep watching it at this moment ?Follow up in 5 weeks ?

## 2022-02-02 NOTE — Progress Notes (Signed)
[place] Phone: [phone] Subjective:   Bianca Foster, am serving as a scribe for Dr. Hulan Saas.  This visit occurred during the SARS-CoV-2 public health emergency.  Safety protocols were in place, including screening questions prior to the visit, additional usage of staff PPE, and extensive cleaning of exam room while observing appropriate contact time as indicated for disinfecting solutions.   I'm seeing this patient by the request  of:  Pa, [facility]  CC: Left knee and low back pain  TKW:[id number]  12/29/2021 Patient does have a low back pain and it does seem to be stable.  Discussed icing regimen and home exercises.  Increase activity as tolerated.  Follow-up in 6 to 8 weeks.  Update 02/08/2022 Bianca Foster is a 71 y.o. female coming in with complaint of lumbar spine pain and L knee pain.  Patient last exam was doing well and held off on viscosupplementation regarding the knee.  Patient's back pain also seems to be stable.  Patient states that her pain is improving. Pain is 4/10. Pain has shifted into L hamstring but is less in lower leg.   Continues to have burning and tingling in R leg. Exercises provide her with some relief. Overall feels the same and is able to manage pain levels.      Past Medical History:  Diagnosis Date   DM (diabetes mellitus) ([place])    Hyperlipidemia    Hypertension    IBS (irritable bowel syndrome)    Macular degeneration (senile) of retina    Primary open angle glaucoma of both eyes    Past Surgical History:  Procedure Laterality Date   ROTATOR CUFF REPAIR     TUBAL LIGATION     Social History   Socioeconomic History   Marital status: Divorced    Spouse name: Not on file   Number of children: Not on file   Years of education: Not on file   Highest education level: Not on file  Occupational History   Not on file  Tobacco Use   Smoking  status: Former    Types: Cigarettes    Quit date: 05/20/2009    Years since quitting: 12.7   Smokeless tobacco: Never  Substance and Sexual Activity   Alcohol use: Yes    Alcohol/week: 1.0 standard drink    Types: 1 Glasses of wine per week   Drug use: Foster   Sexual activity: Not on file  Other Topics Concern   Not on file  Social History Narrative   Not on file   Social Determinants of Health   Financial Resource Strain: Not on file  Food Insecurity: Not on file  Transportation Needs: Not on file  Physical Activity: Not on file  Stress: Not on file  Social Connections: Not on file   Allergies  Allergen Reactions   Codeine Nausea And Vomiting   Cortisone     Swollen foot   Prednisone     Swollen shut eyes   Family History  Problem Relation Age of Onset   Alzheimer's disease Mother    Heart disease Father        valve replacement   Hypertension Sister    Hyperlipidemia Sister    Dementia Sister      Current Outpatient Medications (Cardiovascular):    amLODipine (NORVASC) 10 MG tablet, Take 1 tablet (10 mg total) by mouth daily.  Current Outpatient Medications (Respiratory):  cetirizine (ZYRTEC) 10 MG tablet, Take 10 mg by mouth daily as needed for allergies.    Current Outpatient Medications (Other):    ALPRAZolam (NIRAVAM) 0.5 MG dissolvable tablet, Take 0.5 mg by mouth at bedtime as needed for anxiety.   Bilberry, Vaccinium myrtillus, (BILBERRY PO), Take by mouth.   Biotin 2.5 MG TABS, Take by mouth.   gabapentin (NEURONTIN) 100 MG capsule, Take 100 mg by mouth at bedtime as needed.   Multiple Vitamin (MULTIVITAMIN) tablet, Take 1 tablet by mouth daily.   tiZANidine (ZANAFLEX) 2 MG tablet, Take 1 tablet (2 mg total) by mouth at bedtime.   Reviewed prior external information including notes and imaging from  primary care provider As well as notes that were available from care everywhere and other healthcare systems.  Past medical history, social,  surgical and family history all reviewed in electronic medical record.  Foster pertanent information unless stated regarding to the chief complaint.   Review of Systems:  Foster headache, visual changes, nausea, vomiting, diarrhea, constipation, dizziness, abdominal pain, skin rash, fevers, chills, night sweats, weight loss, swollen lymph nodes, body aches, joint swelling, chest pain, shortness of breath, mood changes. POSITIVE muscle aches  Objective  Blood pressure 110/76, pulse 86, height '5\' 2"'$  (1.575 m), SpO2 99 %.   General: Foster apparent distress alert and oriented x3 mood and affect normal, dressed appropriately.  HEENT: Pupils equal, extraocular movements intact  Respiratory: Patient's speak in full sentences and does not appear short of breath  Cardiovascular: Foster lower extremity edema, non tender, Foster erythema  Gait normal with good balance and coordination.  MSK: Left knee exam does still have trace effusion noted.  Patient does have patellofemoral tracking.  Tender to palpation more over the lateral posterior aspect of the knee.  After informed written and verbal consent, patient was seated on exam table. Left knee was prepped with alcohol swab and utilizing anterolateral approach, patient's left knee space was injected with 40 mg per 3 mL of Monovisc (sodium hyaluronate) in a prefilled syringe was injected easily into the knee through a 22-gauge needle..Patient tolerated the procedure well without immediate complications.    Impression and Recommendations:     The above documentation has been reviewed and is accurate and complete [name], DO

## 2022-02-08 ENCOUNTER — Encounter: Payer: Self-pay | Admitting: Family Medicine

## 2022-02-08 ENCOUNTER — Ambulatory Visit: Payer: Medicare HMO | Admitting: Family Medicine

## 2022-02-08 DIAGNOSIS — M1712 Unilateral primary osteoarthritis, left knee: Secondary | ICD-10-CM | POA: Insufficient documentation

## 2022-02-08 NOTE — Assessment & Plan Note (Signed)
Patient does have a meniscal injury as well as some underlying arthritic changes.  Continue to have some discomfort and pain in the lower extremity.  We discussed differential does include more of a lumbar radiculopathy.  Patient at this point though feels it is more from the knee.  Hopefully viscosupplementation would be helpful.  If not we discussed potential advanced imaging which patient also declined today.  Patient will follow-up with me again in 8 weeks for further evaluation and treatment.

## 2022-02-08 NOTE — Patient Instructions (Addendum)
Gel injection in L knee today See me in 6-8 weeks

## 2022-04-02 NOTE — Progress Notes (Unsigned)
[place] Phone: [phone] Subjective:   Bianca Bianca Foster, am serving as a scribe for Dr. Hulan Foster.   I'm seeing this patient by the request  of:  Bianca Foster, Bianca Bianca Foster  CC: left knee and leg pain   UTM:[id number]  02/08/2022 Patient does have a meniscal injury as well as some underlying arthritic changes.  Continue to have some discomfort and pain in the lower extremity.  We discussed differential does include more of a lumbar radiculopathy.  Patient at this point though feels it is more from the knee.  Hopefully viscosupplementation would be helpful.  If not we discussed potential advanced imaging which patient also declined today.  Patient will follow-up with me again in 8 weeks for further evaluation and treatment.  Update 04/05/2022 Bianca Bianca Foster is a 71 y.o. female coming in with complaint of L knee pain. Patient states that she continues to have L leg pain. Pain started in the L calf and pain is traveling up hamstring. Pain is constant. Using compression over quad for pain relief. Gel injection did not help.   Wonders if her BP medication is causing leg cramping and tingling in her toes.      Past Medical History:  Diagnosis Date   DM (diabetes mellitus) (Bianca Bianca Foster)    Hyperlipidemia    Hypertension    IBS (irritable bowel syndrome)    Macular degeneration (senile) of retina    Primary open angle glaucoma of both eyes    Past Surgical History:  Procedure Laterality Date   ROTATOR CUFF REPAIR     TUBAL LIGATION     Social History   Socioeconomic History   Marital status: Divorced    Spouse name: Not on file   Number of children: Not on file   Years of education: Not on file   Highest education level: Not on file  Occupational History   Not on file  Tobacco Use   Smoking status: Former    Types: Cigarettes    Quit date: 05/20/2009    Years since quitting: 12.8   Smokeless  tobacco: Never  Substance and Sexual Activity   Alcohol use: Yes    Alcohol/week: 1.0 standard drink of alcohol    Types: 1 Glasses of wine per week   Drug use: Bianca Foster   Sexual activity: Not on file  Other Topics Concern   Not on file  Social History Narrative   Not on file   Social Determinants of Health   Financial Resource Strain: Not on file  Food Insecurity: Not on file  Transportation Needs: Not on file  Physical Activity: Not on file  Stress: Not on file  Social Connections: Not on file   Allergies  Allergen Reactions   Codeine Nausea And Vomiting   Cortisone     Swollen foot   Prednisone     Swollen shut eyes   Family History  Problem Relation Age of Onset   Alzheimer's disease Mother    Heart disease Father        valve replacement   Hypertension Sister    Hyperlipidemia Sister    Dementia Sister      Current Outpatient Medications (Cardiovascular):    amLODipine (NORVASC) 10 MG tablet, Take 1 tablet (10 mg total) by mouth daily.  Current Outpatient Medications (Respiratory):    cetirizine (ZYRTEC) 10 MG tablet, Take 10 mg by mouth daily as needed for allergies.  Current Outpatient Medications (Other):    ALPRAZolam (NIRAVAM) 0.5 MG dissolvable tablet, Take 0.5 mg by mouth at bedtime as needed for anxiety.   Bilberry, Vaccinium myrtillus, (BILBERRY PO), Take by mouth.   Biotin 2.5 MG TABS, Take by mouth.   gabapentin (NEURONTIN) 100 MG capsule, Take 100 mg by mouth at bedtime as needed.   Multiple Vitamin (MULTIVITAMIN) tablet, Take 1 tablet by mouth daily.   tiZANidine (ZANAFLEX) 2 MG tablet, Take 1 tablet (2 mg total) by mouth at bedtime.   Reviewed prior external information including notes and imaging from  primary care provider As well as notes that were available from care everywhere and other healthcare systems.  Past medical history, social, surgical and family history all reviewed in electronic medical record.  Bianca Foster pertanent information  unless stated regarding to the chief complaint.   Review of Systems:  Bianca Foster headache, visual changes, nausea, vomiting, diarrhea, constipation, dizziness, abdominal pain, skin rash, fevers, chills, night sweats, weight loss, swollen lymph nodes,  chest pain, shortness of breath, mood changes. POSITIVE muscle aches, body aches, joint swelling  Objective  Blood pressure 118/78, pulse 67, height '5\' 2"'$  (1.575 m), weight 153 lb (69.4 kg), SpO2 97 %.   General: Bianca Foster apparent distress alert and oriented x3 mood and affect normal, dressed appropriately.  HEENT: Pupils equal, extraocular movements intact  Respiratory: Patient's speak in full sentences and does not appear short of breath  Cardiovascular: Bianca Foster lower extremity edema, non tender, Bianca Foster erythema  Left knee still has significant discomfort noted more on the lateral aspect of the patellofemoral joint.  Mild increased valgus and varus force.  Tenderness to palpation in the last week.  Patient today.   After informed written and verbal consent, patient was seated on exam table. Left knee was prepped with alcohol swab and utilizing anterolateral approach, patient's left knee space was injected with 4:1  marcaine 0.5%: Kenalog '40mg'$ /dL. Patient tolerated the procedure well without immediate complications.   Impression and Recommendations:     The above documentation has been reviewed and is accurate and complete Bianca Pulley, DO

## 2022-04-05 ENCOUNTER — Encounter: Payer: Self-pay | Admitting: Family Medicine

## 2022-04-05 ENCOUNTER — Ambulatory Visit: Payer: Medicare HMO | Admitting: Family Medicine

## 2022-04-05 DIAGNOSIS — M1712 Unilateral primary osteoarthritis, left knee: Secondary | ICD-10-CM

## 2022-04-05 NOTE — Patient Instructions (Addendum)
Injected knee today See me in 2-3 months

## 2022-04-05 NOTE — Assessment & Plan Note (Signed)
Patient did not respond well to the viscosupplementation.  Given another steroid injection today, tolerated the procedure well, discussed icing regimen and home exercises otherwise.  We discussed with the lateral meniscal tear would patient be a candidate for possible arthroscopic surgery which patient declined to do at this moment.  Patient will follow-up again in 2 to 3 months otherwise.

## 2022-04-14 DIAGNOSIS — Z23 Encounter for immunization: Secondary | ICD-10-CM | POA: Diagnosis not present

## 2022-04-14 DIAGNOSIS — I10 Essential (primary) hypertension: Secondary | ICD-10-CM | POA: Diagnosis not present

## 2022-04-14 DIAGNOSIS — E119 Type 2 diabetes mellitus without complications: Secondary | ICD-10-CM | POA: Diagnosis not present

## 2022-04-14 DIAGNOSIS — E785 Hyperlipidemia, unspecified: Secondary | ICD-10-CM | POA: Diagnosis not present

## 2022-04-14 DIAGNOSIS — F419 Anxiety disorder, unspecified: Secondary | ICD-10-CM | POA: Diagnosis not present

## 2022-04-14 DIAGNOSIS — Z0000 Encounter for general adult medical examination without abnormal findings: Secondary | ICD-10-CM | POA: Diagnosis not present

## 2022-04-19 DIAGNOSIS — Z1231 Encounter for screening mammogram for malignant neoplasm of breast: Secondary | ICD-10-CM | POA: Diagnosis not present

## 2022-06-10 NOTE — Progress Notes (Signed)
[facility] [place] [address] [place] Phone: [phone] Subjective:   [name], am serving as a scribe for Dr. Hulan Saas.  I'm seeing this patient by the request  of:  Pa, [facility]  CC: Leg pain, knee pain and back pain follow-up  [id number]  04/05/2022 Patient did not respond well to the viscosupplementation.  Given another steroid injection today, tolerated the procedure well, discussed icing regimen and home exercises otherwise.  We discussed with the lateral meniscal tear would patient be a candidate for possible arthroscopic surgery which patient declined to do at this moment.  Patient will follow-up again in 2 to 3 months otherwise.  Update 06/11/2022 Bianca Foster is a 71 y.o. female coming in with complaint of L knee pain. Patient states here for back pain. Has had a couple of episodes recently where she isn't able to walk because of the pain in her back. Its the right side hip that has the most pain. Has new PCP and got a referral to a chiropractor. Also thinks this is happening possibly because her epidural is wearing off.        Past Medical History:  Diagnosis Date   DM (diabetes mellitus) ([place])    Hyperlipidemia    Hypertension    IBS (irritable bowel syndrome)    Macular degeneration (senile) of retina    Primary open angle glaucoma of both eyes    Past Surgical History:  Procedure Laterality Date   ROTATOR CUFF REPAIR     TUBAL LIGATION     Social History   Socioeconomic History   Marital status: Divorced    Spouse name: Not on file   Number of children: Not on file   Years of education: Not on file   Highest education level: Not on file  Occupational History   Not on file  Tobacco Use   Smoking status: Former    Types: Cigarettes    Quit date: 05/20/2009    Years since quitting: 13.0   Smokeless tobacco: Never  Substance and Sexual Activity   Alcohol use: Yes     Alcohol/week: 1.0 standard drink of alcohol    Types: 1 Glasses of wine per week   Drug use: No   Sexual activity: Not on file  Other Topics Concern   Not on file  Social History Narrative   Not on file   Social Determinants of Health   Financial Resource Strain: Not on file  Food Insecurity: Not on file  Transportation Needs: Not on file  Physical Activity: Not on file  Stress: Not on file  Social Connections: Not on file   Allergies  Allergen Reactions   Codeine Nausea And Vomiting   Cortisone     Swollen foot   Prednisone     Swollen shut eyes   Family History  Problem Relation Age of Onset   Alzheimer's disease Mother    Heart disease Father        valve replacement   Hypertension Sister    Hyperlipidemia Sister    Dementia Sister      Current Outpatient Medications (Cardiovascular):    amLODipine (NORVASC) 10 MG tablet, Take 1 tablet (10 mg total) by mouth daily.  Current Outpatient Medications (Respiratory):    cetirizine (ZYRTEC) 10 MG tablet, Take 10 mg by mouth daily as needed for allergies.    Current Outpatient Medications (Other):    ALPRAZolam (NIRAVAM) 0.5 MG dissolvable tablet,  Take 0.5 mg by mouth at bedtime as needed for anxiety.   Bilberry, Vaccinium myrtillus, (BILBERRY PO), Take by mouth.   Biotin 2.5 MG TABS, Take by mouth.   gabapentin (NEURONTIN) 100 MG capsule, Take 100 mg by mouth at bedtime as needed.   Multiple Vitamin (MULTIVITAMIN) tablet, Take 1 tablet by mouth daily.   tiZANidine (ZANAFLEX) 2 MG tablet, Take 1 tablet (2 mg total) by mouth at bedtime.   Reviewed prior external information including notes and imaging from  primary care provider As well as notes that were available from care everywhere and other healthcare systems.  Past medical history, social, surgical and family history all reviewed in electronic medical record.  No pertanent information unless stated regarding to the chief complaint.   Review of Systems:  No  headache, visual changes, nausea, vomiting, diarrhea, constipation, dizziness, abdominal pain, skin rash, fevers, chills, night sweats, weight loss, swollen lymph nodes, body aches, joint swelling, chest pain, shortness of breath, mood changes. POSITIVE muscle aches, body aches, joint swelling  Objective  Blood pressure 118/80, pulse 85, height '5\' 2"'$  (1.575 m), weight 154 lb (69.9 kg), SpO2 98 %.   General: No apparent distress alert and oriented x3 mood and affect normal, dressed appropriately.  HEENT: Pupils equal, extraocular movements intact  Respiratory: Patient's speak in full sentences and does not appear short of breath  Cardiovascular: No lower extremity edema, non tender, no erythema  Back exam does show loss of lordosis.  Positive straight leg test on the left side.  Patient does have some mild weakness with 4 out of 5 strength but seems to be symmetric to open.  Lower extremities.  Pain seems to be out of proportion. Left knee does have some degenerative changes noted.    Impression and Recommendations:      The above documentation has been reviewed and is accurate and complete [name], DO

## 2022-06-11 ENCOUNTER — Ambulatory Visit: Payer: Medicare HMO | Admitting: Family Medicine

## 2022-06-11 VITALS — BP 118/80 | HR 85 | Ht 62.0 in | Wt 154.0 lb

## 2022-06-11 DIAGNOSIS — M5441 Lumbago with sciatica, right side: Secondary | ICD-10-CM | POA: Diagnosis not present

## 2022-06-11 DIAGNOSIS — M1712 Unilateral primary osteoarthritis, left knee: Secondary | ICD-10-CM | POA: Diagnosis not present

## 2022-06-11 DIAGNOSIS — G8929 Other chronic pain: Secondary | ICD-10-CM

## 2022-06-11 MED ORDER — KETOROLAC TROMETHAMINE 30 MG/ML IJ SOLN
30.0000 mg | Freq: Once | INTRAMUSCULAR | Status: AC
  Administered 2022-06-11: 30 mg via INTRAMUSCULAR

## 2022-06-11 MED ORDER — METHYLPREDNISOLONE ACETATE 40 MG/ML IJ SUSP
40.0000 mg | Freq: Once | INTRAMUSCULAR | Status: AC
  Administered 2022-06-11: 40 mg via INTRAMUSCULAR

## 2022-06-11 NOTE — Assessment & Plan Note (Signed)
Severe low back pain.  Known to have spinal stenosis.  Do feel another epidural could be beneficial.  Do think that this is causing more discomfort in her knee at the moment.  Discussed posture and ergonomics.  Patient will have the epidural and follow-up with me 6 weeks afterwards.

## 2022-06-11 NOTE — Patient Instructions (Addendum)
Cocktail injection today [place] [phone] Call Today

## 2022-06-11 NOTE — Assessment & Plan Note (Addendum)
Known arthritis we discussed with potential for viscosupplementation.  1 to see how patient responds to the epidural and how much this is potentially contributing to more of the back pain.  Follow-up with me again in 6 weeks otherwise.

## 2022-06-14 ENCOUNTER — Ambulatory Visit: Payer: Medicare HMO | Admitting: Family Medicine

## 2022-07-09 ENCOUNTER — Ambulatory Visit
Admission: RE | Admit: 2022-07-09 | Discharge: 2022-07-09 | Disposition: A | Discharge status: Home / Self Care | Payer: Medicare HMO | Source: Ambulatory Visit | Attending: Family Medicine | Admitting: Family Medicine

## 2022-07-09 DIAGNOSIS — M5116 Intervertebral disc disorders with radiculopathy, lumbar region: Secondary | ICD-10-CM | POA: Diagnosis not present

## 2022-07-09 DIAGNOSIS — G8929 Other chronic pain: Secondary | ICD-10-CM

## 2022-07-09 MED ORDER — IOPAMIDOL (ISOVUE-M 200) INJECTION 41%
1.0000 mL | Freq: Once | INTRAMUSCULAR | Status: AC
  Administered 2022-07-09: 1 mL via EPIDURAL

## 2022-07-09 MED ORDER — METHYLPREDNISOLONE ACETATE 40 MG/ML INJ SUSP (RADIOLOG
80.0000 mg | Freq: Once | INTRAMUSCULAR | Status: AC
  Administered 2022-07-09: 80 mg via EPIDURAL

## 2022-07-09 NOTE — Discharge Instructions (Signed)

## 2022-07-19 NOTE — Progress Notes (Signed)
[facility] [place] Phone: [phone] Subjective:   Rito Ehrlich, am serving as a scribe for Dr. Hulan Saas.  I'm seeing this patient by the request  of:  Pa, [facility]  CC: low back and knee pain    [id number]  06/11/2022 Known arthritis we discussed with potential for viscosupplementation.  1 to see how patient responds to the epidural and how much this is potentially contributing to more of the back pain.  Follow-up with me again in 6 weeks otherwise.   Bianca Foster is a 71 y.o. female coming in with complaint of LBP and L knee pain. Patient states that the epidural did not work and feels like since the epidural the pain from her right hip down her leg that has been bothering her/ feels like she has a knotted/cramping muscle that she cannot get to loosen up and the pain radiates down her leg the same way the injection radiated down her leg.  Patent states her left knee has calmed down.        Past Medical History:  Diagnosis Date   DM (diabetes mellitus) ([place])    Hyperlipidemia    Hypertension    IBS (irritable bowel syndrome)    Macular degeneration (senile) of retina    Primary open angle glaucoma of both eyes    Past Surgical History:  Procedure Laterality Date   ROTATOR CUFF REPAIR     TUBAL LIGATION     Social History   Socioeconomic History   Marital status: Divorced    Spouse name: Not on file   Number of children: Not on file   Years of education: Not on file   Highest education level: Not on file  Occupational History   Not on file  Tobacco Use   Smoking status: Former    Types: Cigarettes    Quit date: 05/20/2009    Years since quitting: 13.1   Smokeless tobacco: Never  Substance and Sexual Activity   Alcohol use: Yes    Alcohol/week: 1.0 standard drink of alcohol    Types: 1 Glasses of wine per week   Drug use: No   Sexual activity: Not on file   Other Topics Concern   Not on file  Social History Narrative   Not on file   Social Determinants of Health   Financial Resource Strain: Not on file  Food Insecurity: Not on file  Transportation Needs: Not on file  Physical Activity: Not on file  Stress: Not on file  Social Connections: Not on file   Allergies  Allergen Reactions   Codeine Nausea And Vomiting   Cortisone     Swollen foot   Prednisone     Swollen shut eyes   Family History  Problem Relation Age of Onset   Alzheimer's disease Mother    Heart disease Father        valve replacement   Hypertension Sister    Hyperlipidemia Sister    Dementia Sister      Current Outpatient Medications (Cardiovascular):    amLODipine (NORVASC) 10 MG tablet, Take 1 tablet (10 mg total) by mouth daily.  Current Outpatient Medications (Respiratory):    cetirizine (ZYRTEC) 10 MG tablet, Take 10 mg by mouth daily as needed for allergies.    Current Outpatient Medications (Other):    ALPRAZolam (NIRAVAM) 0.5 MG dissolvable tablet, Take 0.5 mg by mouth at bedtime as needed for anxiety.  Bilberry, Vaccinium myrtillus, (BILBERRY PO), Take by mouth.   Biotin 2.5 MG TABS, Take by mouth.   gabapentin (NEURONTIN) 100 MG capsule, Take 100 mg by mouth at bedtime as needed.   Multiple Vitamin (MULTIVITAMIN) tablet, Take 1 tablet by mouth daily.   tiZANidine (ZANAFLEX) 2 MG tablet, Take 1 tablet (2 mg total) by mouth at bedtime.   Reviewed prior external information including notes and imaging from  primary care provider As well as notes that were available from care everywhere and other healthcare systems.  Past medical history, social, surgical and family history all reviewed in electronic medical record.  No pertanent information unless stated regarding to the chief complaint.   Review of Systems:  No headache, visual changes, nausea, vomiting, diarrhea, constipation, dizziness, abdominal pain, skin rash, fevers, chills, night  sweats, weight loss, swollen lymph nodes, body aches, joint swelling, chest pain, shortness of breath, mood changes. POSITIVE muscle aches  Objective  Blood pressure 120/82, pulse 74, height '5\' 2"'$  (1.575 m), weight 154 lb (69.9 kg), SpO2 98 %.   General: No apparent distress alert and oriented x3 mood and affect normal, dressed appropriately.  HEENT: Pupils equal, extraocular movements intact  Respiratory: Patient's speak in full sentences and does not appear short of breath  Cardiovascular: No lower extremity edema, non tender, no erythema  Patient's back exam does have significant tightness noted.  Patient ambulates with significant caution.  No significant straight leg test but significant tightness of the hamstrings right greater than left.  Osteopathic findings T4 extended rotated and side bent right L1 flexed rotated and side bent right L5 flexed rotated and side bent left Sacrum left on left    Impression and Recommendations:     The above documentation has been reviewed and is accurate and complete [name], DO

## 2022-07-26 ENCOUNTER — Ambulatory Visit: Payer: Medicare HMO | Admitting: Family Medicine

## 2022-07-26 VITALS — BP 120/82 | HR 74 | Ht 62.0 in | Wt 154.0 lb

## 2022-07-26 DIAGNOSIS — M9903 Segmental and somatic dysfunction of lumbar region: Secondary | ICD-10-CM | POA: Diagnosis not present

## 2022-07-26 DIAGNOSIS — G8929 Other chronic pain: Secondary | ICD-10-CM

## 2022-07-26 DIAGNOSIS — M5441 Lumbago with sciatica, right side: Secondary | ICD-10-CM

## 2022-07-26 DIAGNOSIS — M9904 Segmental and somatic dysfunction of sacral region: Secondary | ICD-10-CM

## 2022-07-26 DIAGNOSIS — M9902 Segmental and somatic dysfunction of thoracic region: Secondary | ICD-10-CM | POA: Diagnosis not present

## 2022-07-26 NOTE — Patient Instructions (Signed)
Tried manipulation today  If not better next week let us know.  Give a break on exercises for 2 days Get Voltaren gel OTC  See me again in 5-6 weeks

## 2022-07-27 DIAGNOSIS — M9903 Segmental and somatic dysfunction of lumbar region: Secondary | ICD-10-CM | POA: Insufficient documentation

## 2022-07-27 NOTE — Assessment & Plan Note (Signed)
Patient does have low back pain with intermittent radiculopathy.  Patient's pain is out of proportion.  Advanced imaging shows potential nerve root impingement with mild spinal stenosis.  Patient did respond well to an epidural last year but this time the patient feels like it is aggravated the area.  Patient wanted to try another treatment option and after evaluation was found to have some osteopathic findings and we tried this.  Patient did feel that it made significant improvement immediately.  We discussed though that this is only half of it and needs to continue to work on core strengthening and stability.  Discussed different medications as well.  Patient has had Zanaflex 2 mg to take at bedtime if needed.  Continuing the gabapentin 100 to 200 mg at night as well.  Follow-up with me again in 6 to 8 weeks for further evaluation

## 2022-07-27 NOTE — Assessment & Plan Note (Signed)

## 2022-09-01 NOTE — Progress Notes (Deleted)
  [place] Phone: [phone] Subjective:    I'm seeing this patient by the request  of:  Pa, [facility]  CC:   [id number]  Bianca Foster is a 71 y.o. female coming in with complaint of back and neck pain. OMT 07/26/2022. Patient states   Medications patient has been prescribed: None  Taking:         Reviewed prior external information including notes and imaging from previsou exam, outside providers and external EMR if available.   As well as notes that were available from care everywhere and other healthcare systems.  Past medical history, social, surgical and family history all reviewed in electronic medical record.  No pertanent information unless stated regarding to the chief complaint.   Past Medical History:  Diagnosis Date   DM (diabetes mellitus) ([place])    Hyperlipidemia    Hypertension    IBS (irritable bowel syndrome)    Macular degeneration (senile) of retina    Primary open angle glaucoma of both eyes     Allergies  Allergen Reactions   Codeine Nausea And Vomiting   Cortisone     Swollen foot   Prednisone     Swollen shut eyes     Review of Systems:  No headache, visual changes, nausea, vomiting, diarrhea, constipation, dizziness, abdominal pain, skin rash, fevers, chills, night sweats, weight loss, swollen lymph nodes, body aches, joint swelling, chest pain, shortness of breath, mood changes. POSITIVE muscle aches  Objective  There were no vitals taken for this visit.   General: No apparent distress alert and oriented x3 mood and affect normal, dressed appropriately.  HEENT: Pupils equal, extraocular movements intact  Respiratory: Patient's speak in full sentences and does not appear short of breath  Cardiovascular: No lower extremity edema, non tender, no erythema  Gait MSK:  Back   Osteopathic findings  C2 flexed rotated and side bent right C6  flexed rotated and side bent left T3 extended rotated and side bent right inhaled rib T9 extended rotated and side bent left L2 flexed rotated and side bent right Sacrum right on right       Assessment and Plan:  No problem-specific Assessment & Plan notes found for this encounter.    Nonallopathic problems  Decision today to treat with OMT was based on Physical Exam  After verbal consent patient was treated with HVLA, ME, FPR techniques in cervical, rib, thoracic, lumbar, and sacral  areas  Patient tolerated the procedure well with improvement in symptoms  Patient given exercises, stretches and lifestyle modifications  See medications in patient instructions if given  Patient will follow up in 4-8 weeks             Note: This dictation was prepared with Dragon dictation along with smaller phrase technology. Any transcriptional errors that result from this process are unintentional.

## 2022-09-06 ENCOUNTER — Ambulatory Visit: Payer: Medicare HMO | Admitting: Family Medicine

## 2022-10-06 NOTE — Progress Notes (Signed)
Bianca Foster [address] [place] Phone: [phone] Subjective:   Bianca Foster, am serving as a scribe for Dr. Hulan Saas.  I'm seeing this patient by the request  of:  Pa, [facility]  CC: back and neck pain   [id number]  Bianca Foster is a 72 y.o. female coming in with complaint of back and neck pain. OMT 07/26/2022. Patient states felt good for a couple of weeks after manipulation. Would like to try again and see if she could get on a regimen.  Medications patient has been prescribed: None  Taking:         Reviewed prior external information including notes and imaging from previsou exam, outside providers and external EMR if available.   As well as notes that were available from care everywhere and other healthcare systems.  Past medical history, social, surgical and family history all reviewed in electronic medical record.  No pertanent information unless stated regarding to the chief complaint.   Past Medical History:  Diagnosis Date   DM (diabetes mellitus) ([place])    Hyperlipidemia    Hypertension    IBS (irritable bowel syndrome)    Macular degeneration (senile) of retina    Primary open angle glaucoma of both eyes     Allergies  Allergen Reactions   Codeine Nausea And Vomiting   Cortisone     Swollen foot   Prednisone     Swollen shut eyes     Review of Systems:  No headache, visual changes, nausea, vomiting, diarrhea, constipation, dizziness, abdominal pain, skin rash, fevers, chills, night sweats, weight loss, swollen lymph nodes, body aches, joint swelling, chest pain, shortness of breath, mood changes. POSITIVE muscle aches  Objective  Blood pressure 130/78, pulse 88, height '5\' 2"'$  (1.575 m), weight 153 lb (69.4 kg), SpO2 90 %.   General: No apparent distress alert and oriented x3 mood and affect normal, dressed appropriately.  HEENT: Pupils equal, extraocular movements  intact  Respiratory: Patient's speak in full sentences and does not appear short of breath  Cardiovascular: No lower extremity edema, non tender, no erythema  Low back still has some loss of lordosis.  Some tenderness to palpation in the paraspinal musculature.  Patient does have some tightness with FABER test on the right side.  Patient does have tightness with straight leg test as well noted.  No masses appreciated in the buttocks area.  Osteopathic findings  T3 extended rotated and side bent right inhaled rib T9 extended rotated and side bent left L2 flexed rotated and side bent right Sacrum right on right       Assessment and Plan:  Low back pain Still believe that this is multifactorial.  Continuing to have pain in the buttocks area.  Patient's MRI of the lumbar spine has shown that patient has had a nerve impingement previously.  We discussed with patient icing regimen and home exercises.  Patient wanted to try osteopathic manipulation with patient having some improvements previously.  Discussed with patient about icing regimen and home exercises otherwise.  Follow-up with me again in 4 to 8 weeks.    Nonallopathic problems  Decision today to treat with OMT was based on Physical Exam  After verbal consent patient was treated with HVLA, ME, FPR techniques in rib, thoracic, lumbar, and sacral  areas  Patient tolerated the procedure well with improvement in symptoms  Patient given exercises, stretches and lifestyle modifications  See medications in  patient instructions if given  Patient will follow up in 4-8 weeks    The above documentation has been reviewed and is accurate and complete [name], DO          Note: This dictation was prepared with Dragon dictation along with smaller phrase technology. Any transcriptional errors that result from this process are unintentional.

## 2022-10-12 ENCOUNTER — Encounter: Payer: Self-pay | Admitting: Family Medicine

## 2022-10-12 ENCOUNTER — Ambulatory Visit: Payer: Medicare HMO | Admitting: Family Medicine

## 2022-10-12 VITALS — BP 130/78 | HR 88 | Ht 62.0 in | Wt 153.0 lb

## 2022-10-12 DIAGNOSIS — M5441 Lumbago with sciatica, right side: Secondary | ICD-10-CM

## 2022-10-12 DIAGNOSIS — G8929 Other chronic pain: Secondary | ICD-10-CM | POA: Diagnosis not present

## 2022-10-12 DIAGNOSIS — E119 Type 2 diabetes mellitus without complications: Secondary | ICD-10-CM | POA: Diagnosis not present

## 2022-10-12 DIAGNOSIS — M9904 Segmental and somatic dysfunction of sacral region: Secondary | ICD-10-CM | POA: Diagnosis not present

## 2022-10-12 DIAGNOSIS — M9902 Segmental and somatic dysfunction of thoracic region: Secondary | ICD-10-CM | POA: Diagnosis not present

## 2022-10-12 DIAGNOSIS — M9903 Segmental and somatic dysfunction of lumbar region: Secondary | ICD-10-CM | POA: Diagnosis not present

## 2022-10-12 NOTE — Assessment & Plan Note (Signed)
Still believe that this is multifactorial.  Continuing to have pain in the buttocks area.  Patient's MRI of the lumbar spine has shown that patient has had a nerve impingement previously.  We discussed with patient icing regimen and home exercises.  Patient wanted to try osteopathic manipulation with patient having some improvements previously.  Discussed with patient about icing regimen and home exercises otherwise.  Follow-up with me again in 4 to 8 weeks.

## 2022-10-19 DIAGNOSIS — I10 Essential (primary) hypertension: Secondary | ICD-10-CM | POA: Diagnosis not present

## 2022-10-19 DIAGNOSIS — M79604 Pain in right leg: Secondary | ICD-10-CM | POA: Diagnosis not present

## 2022-10-19 DIAGNOSIS — E785 Hyperlipidemia, unspecified: Secondary | ICD-10-CM | POA: Diagnosis not present

## 2022-10-19 DIAGNOSIS — R7303 Prediabetes: Secondary | ICD-10-CM | POA: Diagnosis not present

## 2022-10-27 DIAGNOSIS — M542 Cervicalgia: Secondary | ICD-10-CM | POA: Diagnosis not present

## 2022-10-27 DIAGNOSIS — M9901 Segmental and somatic dysfunction of cervical region: Secondary | ICD-10-CM | POA: Diagnosis not present

## 2022-10-27 DIAGNOSIS — M9903 Segmental and somatic dysfunction of lumbar region: Secondary | ICD-10-CM | POA: Diagnosis not present

## 2022-10-27 DIAGNOSIS — M9905 Segmental and somatic dysfunction of pelvic region: Secondary | ICD-10-CM | POA: Diagnosis not present

## 2022-10-27 DIAGNOSIS — M5441 Lumbago with sciatica, right side: Secondary | ICD-10-CM | POA: Diagnosis not present

## 2022-11-02 DIAGNOSIS — M9903 Segmental and somatic dysfunction of lumbar region: Secondary | ICD-10-CM | POA: Diagnosis not present

## 2022-11-02 DIAGNOSIS — M9901 Segmental and somatic dysfunction of cervical region: Secondary | ICD-10-CM | POA: Diagnosis not present

## 2022-11-02 DIAGNOSIS — M9905 Segmental and somatic dysfunction of pelvic region: Secondary | ICD-10-CM | POA: Diagnosis not present

## 2022-11-09 DIAGNOSIS — M9901 Segmental and somatic dysfunction of cervical region: Secondary | ICD-10-CM | POA: Diagnosis not present

## 2022-11-09 DIAGNOSIS — M9905 Segmental and somatic dysfunction of pelvic region: Secondary | ICD-10-CM | POA: Diagnosis not present

## 2022-11-09 DIAGNOSIS — M9903 Segmental and somatic dysfunction of lumbar region: Secondary | ICD-10-CM | POA: Diagnosis not present

## 2022-11-18 NOTE — Progress Notes (Deleted)
  [place] Phone: [phone] Subjective:    I'm seeing this patient by the request  of:  Pa, [facility]  CC:   [id number]  Bianca Foster is a 72 y.o. female coming in with complaint of back and neck pain. OMT 10/12/2022. Patient states   Medications patient has been prescribed: None  Taking:         Reviewed prior external information including notes and imaging from previsou exam, outside providers and external EMR if available.   As well as notes that were available from care everywhere and other healthcare systems.  Past medical history, social, surgical and family history all reviewed in electronic medical record.  No pertanent information unless stated regarding to the chief complaint.   Past Medical History:  Diagnosis Date   DM (diabetes mellitus) ([place])    Hyperlipidemia    Hypertension    IBS (irritable bowel syndrome)    Macular degeneration (senile) of retina    Primary open angle glaucoma of both eyes     Allergies  Allergen Reactions   Codeine Nausea And Vomiting   Cortisone     Swollen foot   Prednisone     Swollen shut eyes     Review of Systems:  No headache, visual changes, nausea, vomiting, diarrhea, constipation, dizziness, abdominal pain, skin rash, fevers, chills, night sweats, weight loss, swollen lymph nodes, body aches, joint swelling, chest pain, shortness of breath, mood changes. POSITIVE muscle aches  Objective  There were no vitals taken for this visit.   General: No apparent distress alert and oriented x3 mood and affect normal, dressed appropriately.  HEENT: Pupils equal, extraocular movements intact  Respiratory: Patient's speak in full sentences and does not appear short of breath  Cardiovascular: No lower extremity edema, non tender, no erythema  Gait MSK:  Back   Osteopathic findings  C2 flexed rotated and side bent right C6  flexed rotated and side bent left T3 extended rotated and side bent right inhaled rib T9 extended rotated and side bent left L2 flexed rotated and side bent right Sacrum right on right       Assessment and Plan:  No problem-specific Assessment & Plan notes found for this encounter.    Nonallopathic problems  Decision today to treat with OMT was based on Physical Exam  After verbal consent patient was treated with HVLA, ME, FPR techniques in cervical, rib, thoracic, lumbar, and sacral  areas  Patient tolerated the procedure well with improvement in symptoms  Patient given exercises, stretches and lifestyle modifications  See medications in patient instructions if given  Patient will follow up in 4-8 weeks             Note: This dictation was prepared with Dragon dictation along with smaller phrase technology. Any transcriptional errors that result from this process are unintentional.

## 2022-11-22 DIAGNOSIS — M9905 Segmental and somatic dysfunction of pelvic region: Secondary | ICD-10-CM | POA: Diagnosis not present

## 2022-11-22 DIAGNOSIS — M9903 Segmental and somatic dysfunction of lumbar region: Secondary | ICD-10-CM | POA: Diagnosis not present

## 2022-11-22 DIAGNOSIS — M9901 Segmental and somatic dysfunction of cervical region: Secondary | ICD-10-CM | POA: Diagnosis not present

## 2022-11-23 ENCOUNTER — Ambulatory Visit: Payer: Medicare HMO | Admitting: Family Medicine

## 2022-11-23 DIAGNOSIS — H353131 Nonexudative age-related macular degeneration, bilateral, early dry stage: Secondary | ICD-10-CM | POA: Diagnosis not present

## 2022-11-23 DIAGNOSIS — Z961 Presence of intraocular lens: Secondary | ICD-10-CM | POA: Diagnosis not present

## 2022-11-23 DIAGNOSIS — H40003 Preglaucoma, unspecified, bilateral: Secondary | ICD-10-CM | POA: Diagnosis not present

## 2023-01-03 NOTE — Progress Notes (Unsigned)
[facility] [address] [place] [address] Phone: [phone] Subjective:   Bianca Foster, am serving as a scribe for Dr. Antoine Primas.  I'm seeing this patient by the request  of:  Pa, [facility]  CC: back and neck pain f/u  [id number]  Bianca Foster is a 72 y.o. female coming in with complaint of back and neck pain. OMT on 10/12/2022. Patient states that she saw a chiro who helped to decrease her R hip/glute pain. Continues to have numbness and tingling down R leg daily. Pain in R hip is in piriformis now but feels pain has been manageable.   L knee is better after visco.   Medications patient has been prescribed:   Taking:     Last epidural 10/23   Reviewed prior external information including notes and imaging from previsou exam, outside providers and external EMR if available.   As well as notes that were available from care everywhere and other healthcare systems.  Past medical history, social, surgical and family history all reviewed in electronic medical record.  No pertanent information unless stated regarding to the chief complaint.   Past Medical History:  Diagnosis Date   DM (diabetes mellitus) (HCC)    Hyperlipidemia    Hypertension    IBS (irritable bowel syndrome)    Macular degeneration (senile) of retina    Primary open angle glaucoma of both eyes     Allergies  Allergen Reactions   Codeine Nausea And Vomiting   Cortisone     Swollen foot   Prednisone     Swollen shut eyes     Review of Systems:  No headache, visual changes, nausea, vomiting, diarrhea, constipation, dizziness, abdominal pain, skin rash, fevers, chills, night sweats, weight loss, swollen lymph nodes, body aches, joint swelling, chest pain, shortness of breath, mood changes. POSITIVE muscle aches  Objective  Blood pressure 120/80, pulse 86, height 5\' 2"  (1.575 m), weight 155 lb (70.3 kg), SpO2 98 %.   General: No  apparent distress alert and oriented x3 mood and affect normal, dressed appropriately.  HEENT: Pupils equal, extraocular movements intact  Respiratory: Patient's speak in full sentences and does not appear short of breath  Cardiovascular: No lower extremity edema, non tender, no erythema  Low back exam does have some mild loss of lordosis.  Some tenderness to palpation in the paraspinal musculature.  Patient does have tightness with Pearlean Brownie right greater than left.  Osteopathic findings  C6 flexed rotated and side bent left T3 extended rotated and side bent right inhaled rib T9 extended rotated and side bent left L2 flexed rotated and side bent right L5 flexed rotated and side bent left Sacrum right on right     Assessment and Plan:  Low back pain Low back does have some loss lordosis noted.  Some tenderness to palpation in the paraspinal musculature.  Tightness with Pearlean Brownie right greater than left.  Continue to monitor.  X-rays, Last evaluated the patient. Follow-up again in 6    Nonallopathic problems  Decision today to treat with OMT was based on Physical Exam  After verbal consent patient was treated with HVLA, ME, FPR techniques in cervical, rib, thoracic, lumbar, and sacral  areas  Patient tolerated the procedure well with improvement in symptoms  Patient given exercises, stretches and lifestyle modifications  See medications in patient instructions if given  Patient will follow up in 4-8 weeks    The above documentation has  been reviewed and is accurate and complete Bianca Pulley, DO          Note: This dictation was prepared with Dragon dictation along with smaller phrase technology. Any transcriptional errors that result from this process are unintentional.

## 2023-01-04 ENCOUNTER — Encounter: Payer: Self-pay | Admitting: Family Medicine

## 2023-01-04 ENCOUNTER — Ambulatory Visit: Payer: Medicare HMO | Admitting: Family Medicine

## 2023-01-04 VITALS — BP 120/80 | HR 86 | Ht 62.0 in | Wt 155.0 lb

## 2023-01-04 DIAGNOSIS — M9902 Segmental and somatic dysfunction of thoracic region: Secondary | ICD-10-CM

## 2023-01-04 DIAGNOSIS — G8929 Other chronic pain: Secondary | ICD-10-CM

## 2023-01-04 DIAGNOSIS — M5441 Lumbago with sciatica, right side: Secondary | ICD-10-CM | POA: Diagnosis not present

## 2023-01-04 DIAGNOSIS — M9908 Segmental and somatic dysfunction of rib cage: Secondary | ICD-10-CM

## 2023-01-04 DIAGNOSIS — M9903 Segmental and somatic dysfunction of lumbar region: Secondary | ICD-10-CM | POA: Diagnosis not present

## 2023-01-04 DIAGNOSIS — M9901 Segmental and somatic dysfunction of cervical region: Secondary | ICD-10-CM

## 2023-01-04 DIAGNOSIS — M9904 Segmental and somatic dysfunction of sacral region: Secondary | ICD-10-CM | POA: Diagnosis not present

## 2023-01-04 NOTE — Patient Instructions (Signed)
Good to see you Parking placard See me again in 6-8 weeks

## 2023-01-04 NOTE — Assessment & Plan Note (Signed)
Low back does have some loss lordosis noted.  Some tenderness to palpation in the paraspinal musculature.  Tightness with Pearlean Brownie right greater than left.  Continue to monitor.  X-rays, Last evaluated the patient. Follow-up again in 6

## 2023-02-03 ENCOUNTER — Telehealth: Payer: Self-pay | Admitting: Family Medicine

## 2023-02-03 NOTE — Telephone Encounter (Signed)
Called patient. She said that pain typically runs down lateral aspect of thigh. Pain is now in R quad. No new injury. If she pushes on her back the pain will change in the R quad. Pain feels like a cramp. Has been using Advil and tylenol. Per a verbal from Dr. Katrinka Blazing patient should go into ED if pain is worsening or can see another provider. Offered appointment with Dr. Denyse Amass tomorrow and patient declined. Told patient to go into ED if pain worsens. She voices understanding.

## 2023-02-03 NOTE — Telephone Encounter (Signed)
Patient called stating that she is having pain in her right leg and asked if she could come in to get a shot for pain. She said that Dr Katrinka Blazing has told her before to stop in to see if that is something we could do.  Would Dr Katrinka Blazing like for her to have a nurse visit or what would he suggest?  Please advise.

## 2023-02-21 NOTE — Progress Notes (Unsigned)
  [facility] [address] [place] [address] Phone: [phone] Subjective:   Bruce Donath, am serving as a scribe for Dr. Antoine Primas.  I'm seeing this patient by the request  of:  Pa, [facility]  CC: Low back pain and leg pain follow-up  [id number]  Bianca Foster is a 72 y.o. female coming in with complaint of back and neck pain. OMT 01/04/2023.  Patient in May had an exacerbation of back pain and seem to be more on the right side radicular symptoms.  Patient called for this and was given an opportunity for another provider.  Patient declined and was told to go to the emergency room if worsening symptoms and did not.  Patient states that her piriformis on R side started to bother her and pain radiated into the quad. Pain has since diminished but still having tenderness. Unsure if she needed appointment today. She has good and bad days.   Medications patient has been prescribed: None  Taking:         Reviewed prior external information including notes and imaging from previsou exam, outside providers and external EMR if available.   As well as notes that were available from care everywhere and other healthcare systems.  Past medical history, social, surgical and family history all reviewed in electronic medical record.  No pertanent information unless stated regarding to the chief complaint.  \ Objective  Blood pressure 118/72, pulse 73, height 5\' 2"  (1.575 m), weight 154 lb (69.9 kg), SpO2 99 %.   General: No apparent distress alert and oriented x3 mood and affect normal, dressed appropriately.  HEENT: Pupils equal, extraocular movements intact  Respiratory: Patient's speak in full sentences and does not appear short of breath  Cardiovascular: No lower extremity edema, non tender, no erythema  Gait MSK:  Back: Back does have some mild loss of lordosis.  Tightness noted with FABER test on the right side fairly  significantly.        Assessment and Plan:  Low back pain Chronic problem but patient seems to be doing relatively well at the moment.  No radicular symptoms.  Given the exercises for the piriformis and the low back again if necessary.  Worsening symptoms could come in for a Toradol and Depo-Medrol injection.  Seems to be doing better with that epidurals.  Follow-up with me again as needed otherwise.         The above documentation has been reviewed and is accurate and complete Judi Saa, DO         Note: This dictation was prepared with Dragon dictation along with smaller phrase technology. Any transcriptional errors that result from this process are unintentional.

## 2023-02-22 ENCOUNTER — Ambulatory Visit: Payer: Medicare HMO | Admitting: Family Medicine

## 2023-02-22 ENCOUNTER — Encounter: Payer: Self-pay | Admitting: Family Medicine

## 2023-02-22 VITALS — BP 118/72 | HR 73 | Ht 62.0 in | Wt 154.0 lb

## 2023-02-22 DIAGNOSIS — M5441 Lumbago with sciatica, right side: Secondary | ICD-10-CM

## 2023-02-22 DIAGNOSIS — G8929 Other chronic pain: Secondary | ICD-10-CM

## 2023-02-22 NOTE — Patient Instructions (Signed)
Exercises 3x a week If you need cocktail injection you can get on nurse visit See me when you need me

## 2023-02-22 NOTE — Assessment & Plan Note (Signed)
Chronic problem but patient seems to be doing relatively well at the moment.  No radicular symptoms.  Given the exercises for the piriformis and the low back again if necessary.  Worsening symptoms could come in for a Toradol and Depo-Medrol injection.  Seems to be doing better with that epidurals.  Follow-up with me again as needed otherwise.

## 2023-04-19 DIAGNOSIS — L659 Nonscarring hair loss, unspecified: Secondary | ICD-10-CM | POA: Diagnosis not present

## 2023-04-19 DIAGNOSIS — Z0000 Encounter for general adult medical examination without abnormal findings: Secondary | ICD-10-CM | POA: Diagnosis not present

## 2023-04-19 DIAGNOSIS — I10 Essential (primary) hypertension: Secondary | ICD-10-CM | POA: Diagnosis not present

## 2023-04-19 DIAGNOSIS — Z23 Encounter for immunization: Secondary | ICD-10-CM | POA: Diagnosis not present

## 2023-04-19 DIAGNOSIS — R7303 Prediabetes: Secondary | ICD-10-CM | POA: Diagnosis not present

## 2023-04-19 DIAGNOSIS — E785 Hyperlipidemia, unspecified: Secondary | ICD-10-CM | POA: Diagnosis not present

## 2023-04-25 DIAGNOSIS — Z1231 Encounter for screening mammogram for malignant neoplasm of breast: Secondary | ICD-10-CM | POA: Diagnosis not present

## 2023-06-22 DIAGNOSIS — R053 Chronic cough: Secondary | ICD-10-CM | POA: Diagnosis not present

## 2023-06-22 DIAGNOSIS — K5909 Other constipation: Secondary | ICD-10-CM | POA: Diagnosis not present

## 2023-06-22 DIAGNOSIS — Z9109 Other allergy status, other than to drugs and biological substances: Secondary | ICD-10-CM | POA: Diagnosis not present

## 2023-07-26 ENCOUNTER — Ambulatory Visit: Payer: Medicare HMO | Admitting: Family Medicine

## 2023-07-26 ENCOUNTER — Encounter: Payer: Self-pay | Admitting: Family Medicine

## 2023-07-26 ENCOUNTER — Other Ambulatory Visit: Payer: Self-pay

## 2023-07-26 VITALS — BP 122/76 | HR 78 | Ht 62.0 in | Wt 154.0 lb

## 2023-07-26 DIAGNOSIS — M1711 Unilateral primary osteoarthritis, right knee: Secondary | ICD-10-CM | POA: Diagnosis not present

## 2023-07-26 DIAGNOSIS — M79671 Pain in right foot: Secondary | ICD-10-CM

## 2023-07-26 NOTE — Assessment & Plan Note (Signed)
Some arthritic changes noted.  Discussed icing regimen and home exercises, discussed which activities to do and which ones to avoid, increase activity slowly over the course the next several weeks.  Discussed icing regimen.  Follow-up with me again in 6 to 8 weeks otherwise.  Could be a candidate for viscosupplementation.  Handicap placard given

## 2023-07-26 NOTE — Patient Instructions (Signed)
See me again in 6 weeks if you need gel

## 2023-07-26 NOTE — Progress Notes (Signed)
[facility] [address] [place] [address] Phone: [phone] Subjective:   Bruce Donath, am serving as a scribe for Dr. Antoine Primas.  I'm seeing this patient by the request  of:  Pa, [facility]  CC: Right knee, hip back pain  [id number]  02/22/2023 Chronic problem but patient seems to be doing relatively well at the moment.  No radicular symptoms.  Given the exercises for the piriformis and the low back again if necessary.  Worsening symptoms could come in for a Toradol and Depo-Medrol injection.  Seems to be doing better with that epidurals.  Follow-up with me again as needed otherwise.      Update 07/26/2023 EQUILLA QUE is a 72 y.o. female coming in with complaint of lumbar spine pain. Pain, tingling, burning radiating from R hip down leg. Feels like her knee is catching intermittently. Sometimes wakes her up at night.   Also c/o pain in R foot over metatarsal heads over past 2 months. Painful to DF foot.   Visco injection in L knee has been helpful.      Past Medical History:  Diagnosis Date   DM (diabetes mellitus) (HCC)    Hyperlipidemia    Hypertension    IBS (irritable bowel syndrome)    Macular degeneration (senile) of retina    Primary open angle glaucoma of both eyes    Past Surgical History:  Procedure Laterality Date   ROTATOR CUFF REPAIR     TUBAL LIGATION     Social History   Socioeconomic History   Marital status: Divorced    Spouse name: Not on file   Number of children: Not on file   Years of education: Not on file   Highest education level: Not on file  Occupational History   Not on file  Tobacco Use   Smoking status: Former    Current packs/day: 0.00    Types: Cigarettes    Quit date: 05/20/2009    Years since quitting: 14.1   Smokeless tobacco: Never  Substance and Sexual Activity   Alcohol use: Yes    Alcohol/week: 1.0 standard drink of alcohol    Types: 1 Glasses of  wine per week   Drug use: No   Sexual activity: Not on file  Other Topics Concern   Not on file  Social History Narrative   Not on file   Social Determinants of Health   Financial Resource Strain: Not on file  Food Insecurity: Not on file  Transportation Needs: Not on file  Physical Activity: Not on file  Stress: Not on file  Social Connections: Not on file   Allergies  Allergen Reactions   Codeine Nausea And Vomiting   Cortisone     Swollen foot   Prednisone     Swollen shut eyes   Family History  Problem Relation Age of Onset   Alzheimer's disease Mother    Heart disease Father        valve replacement   Hypertension Sister    Hyperlipidemia Sister    Dementia Sister      Current Outpatient Medications (Cardiovascular):    amLODipine (NORVASC) 10 MG tablet, Take 1 tablet (10 mg total) by mouth daily.  Current Outpatient Medications (Respiratory):    cetirizine (ZYRTEC) 10 MG tablet, Take 10 mg by mouth daily as needed for allergies.    Current Outpatient Medications (Other):    ALPRAZolam (NIRAVAM) 0.5 MG dissolvable tablet, Take 0.5 mg  by mouth at bedtime as needed for anxiety.   Bilberry, Vaccinium myrtillus, (BILBERRY PO), Take by mouth.   Biotin 2.5 MG TABS, Take by mouth.   gabapentin (NEURONTIN) 100 MG capsule, Take 100 mg by mouth at bedtime as needed.   Multiple Vitamin (MULTIVITAMIN) tablet, Take 1 tablet by mouth daily.   tiZANidine (ZANAFLEX) 2 MG tablet, Take 1 tablet (2 mg total) by mouth at bedtime.   Reviewed prior external information including notes and imaging from  primary care provider As well as notes that were available from care everywhere and other healthcare systems.  Past medical history, social, surgical and family history all reviewed in electronic medical record.  No pertanent information unless stated regarding to the chief complaint.   Review of Systems:  No headache, visual changes, nausea, vomiting, diarrhea, constipation,  dizziness, abdominal pain, skin rash, fevers, chills, night sweats, weight loss, swollen lymph nodes, body aches, joint swelling, chest pain, shortness of breath, mood changes. POSITIVE muscle aches  Objective  Blood pressure 122/76, pulse 78, height 5\' 2"  (1.575 m), weight 154 lb (69.9 kg), SpO2 99%.   General: No apparent distress alert and oriented x3 mood and affect normal, dressed appropriately.  HEENT: Pupils equal, extraocular movements intact  Respiratory: Patient's speak in full sentences and does not appear short of breath  Cardiovascular: No lower extremity edema, non tender, no erythema  Antalgic favoring the right knee at the moment.  Does have some crepitus noted.  Some instability noted with valgus and varus force.  After informed written and verbal consent, patient was seated on exam table. Right knee was prepped with alcohol swab and utilizing anterolateral approach, patient's right knee space was injected with 4:1  marcaine 0.5%: Kenalog 40mg /dL. Patient tolerated the procedure well without immediate complications.  Low back does have diffuse pain on exam but negative straight leg test noted today.  Seems to be more secondary to the   Impression and Recommendations:    The above documentation has been reviewed and is accurate and complete Judi Saa, DO

## 2023-08-02 DIAGNOSIS — L659 Nonscarring hair loss, unspecified: Secondary | ICD-10-CM | POA: Diagnosis not present

## 2023-11-21 DIAGNOSIS — R7303 Prediabetes: Secondary | ICD-10-CM | POA: Diagnosis not present

## 2023-11-21 DIAGNOSIS — E785 Hyperlipidemia, unspecified: Secondary | ICD-10-CM | POA: Diagnosis not present

## 2023-11-21 DIAGNOSIS — F419 Anxiety disorder, unspecified: Secondary | ICD-10-CM | POA: Diagnosis not present

## 2023-11-21 DIAGNOSIS — Z23 Encounter for immunization: Secondary | ICD-10-CM | POA: Diagnosis not present

## 2023-11-21 DIAGNOSIS — I10 Essential (primary) hypertension: Secondary | ICD-10-CM | POA: Diagnosis not present

## 2023-11-29 DIAGNOSIS — H353131 Nonexudative age-related macular degeneration, bilateral, early dry stage: Secondary | ICD-10-CM | POA: Diagnosis not present

## 2023-11-29 DIAGNOSIS — Z961 Presence of intraocular lens: Secondary | ICD-10-CM | POA: Diagnosis not present

## 2023-11-29 DIAGNOSIS — H40003 Preglaucoma, unspecified, bilateral: Secondary | ICD-10-CM | POA: Diagnosis not present

## 2023-12-05 DIAGNOSIS — Z09 Encounter for follow-up examination after completed treatment for conditions other than malignant neoplasm: Secondary | ICD-10-CM | POA: Diagnosis not present

## 2023-12-05 DIAGNOSIS — Z860100 Personal history of colon polyps, unspecified: Secondary | ICD-10-CM | POA: Diagnosis not present

## 2023-12-05 DIAGNOSIS — K635 Polyp of colon: Secondary | ICD-10-CM | POA: Diagnosis not present

## 2023-12-07 DIAGNOSIS — K635 Polyp of colon: Secondary | ICD-10-CM | POA: Diagnosis not present

## 2024-02-20 NOTE — Progress Notes (Signed)
 [facility] [address] [place] [address] Phone: [phone] Subjective:   Bianca Foster, am serving as a scribe for Dr. Ronnell Coins.  I'm seeing this patient by the request  of:  Pa, [facility]  CC: Back pain, knee pain  [id number]  07/26/2023 Some arthritic changes noted.  Discussed icing regimen and home exercises, discussed which activities to do and which ones to avoid, increase activity slowly over the course the next several weeks.  Discussed icing regimen.  Follow-up with me again in 6 to 8 weeks otherwise.  Could be a candidate for viscosupplementation.  Handicap placard given     Updated 02/21/2024 Bianca Foster is a 73 y.o. female coming in with complaint of R leg pain. Patient states numbness is occurring in R leg down to her foot.   L knee pain due to torn meniscus. States that she has fluid in back of knee. Painful to touch and has spasming in anterior lower leg musculature.        Past Medical History:  Diagnosis Date   DM (diabetes mellitus) (HCC)    Hyperlipidemia    Hypertension    IBS (irritable bowel syndrome)    Macular degeneration (senile) of retina    Primary open angle glaucoma of both eyes    Past Surgical History:  Procedure Laterality Date   ROTATOR CUFF REPAIR     TUBAL LIGATION     Social History   Socioeconomic History   Marital status: Divorced    Spouse name: Not on file   Number of children: Not on file   Years of education: Not on file   Highest education level: Not on file  Occupational History   Not on file  Tobacco Use   Smoking status: Former    Current packs/day: 0.00    Types: Cigarettes    Quit date: 05/20/2009    Years since quitting: 14.7   Smokeless tobacco: Never  Substance and Sexual Activity   Alcohol use: Yes    Alcohol/week: 1.0 standard drink of alcohol    Types: 1 Glasses of wine per week   Drug use: No   Sexual activity: Not on file   Other Topics Concern   Not on file  Social History Narrative   Not on file   Social Drivers of Health   Financial Resource Strain: Not on file  Food Insecurity: Not on file  Transportation Needs: Not on file  Physical Activity: Not on file  Stress: Not on file  Social Connections: Not on file   Allergies  Allergen Reactions   Codeine Nausea And Vomiting   Cortisone     Swollen foot   Prednisone     Swollen shut eyes   Family History  Problem Relation Age of Onset   Alzheimer's disease Mother    Heart disease Father        valve replacement   Hypertension Sister    Hyperlipidemia Sister    Dementia Sister      Current Outpatient Medications (Cardiovascular):    amLODipine  (NORVASC ) 10 MG tablet, Take 1 tablet (10 mg total) by mouth daily.  Current Outpatient Medications (Respiratory):    cetirizine (ZYRTEC) 10 MG tablet, Take 10 mg by mouth daily as needed for allergies.    Current Outpatient Medications (Other):    ALPRAZolam (NIRAVAM) 0.5 MG dissolvable tablet, Take 0.5 mg by mouth at bedtime as needed for anxiety.   Bilberry, Vaccinium myrtillus, (  BILBERRY PO), Take by mouth.   Biotin 2.5 MG TABS, Take by mouth.   gabapentin  (NEURONTIN ) 100 MG capsule, Take 100 mg by mouth at bedtime as needed.   Multiple Vitamin (MULTIVITAMIN) tablet, Take 1 tablet by mouth daily.   tiZANidine  (ZANAFLEX ) 2 MG tablet, Take 1 tablet (2 mg total) by mouth at bedtime.   Reviewed prior external information including notes and imaging from  primary care provider As well as notes that were available from care everywhere and other healthcare systems.  Past medical history, social, surgical and family history all reviewed in electronic medical record.  No pertanent information unless stated regarding to the chief complaint.   Review of Systems:  No headache, visual changes, nausea, vomiting, diarrhea, constipation, dizziness, abdominal pain, skin rash, fevers, chills, night  sweats, weight loss, swollen lymph nodes, body aches, joint swelling, chest pain, shortness of breath, mood changes. POSITIVE muscle aches  Objective  Blood pressure 112/72, pulse (!) 59, height 5\' 2"  (1.575 m), weight 150 lb (68 kg), SpO2 98%.   General: No apparent distress alert and oriented x3 mood and affect normal, dressed appropriately.  HEENT: Pupils equal, extraocular movements intact  Respiratory: Patient's speak in full sentences and does not appear short of breath  Cardiovascular: No lower extremity edema, non tender, no erythema  Neck exam does have some loss lordosis.  Tightness with straight leg test on the right side but no true radicular symptoms. Left knee does have crepitus noted and trace effusion noted.  Some instability with valgus and varus force noted.  After informed written and verbal consent, patient was seated on exam table. Left knee was prepped with alcohol swab and utilizing anterolateral approach, patient's left knee space was injected with 4:1  marcaine 0.5%: Kenalog 40mg /dL. Patient tolerated the procedure well without immediate complications.   Impression and Recommendations:

## 2024-02-21 ENCOUNTER — Telehealth: Payer: Self-pay

## 2024-02-21 ENCOUNTER — Other Ambulatory Visit: Payer: Self-pay

## 2024-02-21 ENCOUNTER — Ambulatory Visit: Admitting: Family Medicine

## 2024-02-21 ENCOUNTER — Encounter: Payer: Self-pay | Admitting: Family Medicine

## 2024-02-21 VITALS — BP 112/72 | HR 59 | Ht 62.0 in | Wt 150.0 lb

## 2024-02-21 DIAGNOSIS — M1712 Unilateral primary osteoarthritis, left knee: Secondary | ICD-10-CM

## 2024-02-21 DIAGNOSIS — M25562 Pain in left knee: Secondary | ICD-10-CM | POA: Diagnosis not present

## 2024-02-21 DIAGNOSIS — M170 Bilateral primary osteoarthritis of knee: Secondary | ICD-10-CM | POA: Diagnosis not present

## 2024-02-21 DIAGNOSIS — M5441 Lumbago with sciatica, right side: Secondary | ICD-10-CM

## 2024-02-21 DIAGNOSIS — M1711 Unilateral primary osteoarthritis, right knee: Secondary | ICD-10-CM | POA: Diagnosis not present

## 2024-02-21 DIAGNOSIS — G8929 Other chronic pain: Secondary | ICD-10-CM | POA: Diagnosis not present

## 2024-02-21 MED ORDER — KETOROLAC TROMETHAMINE 30 MG/ML IJ SOLN
30.0000 mg | Freq: Once | INTRAMUSCULAR | Status: AC
  Administered 2024-02-21: 30 mg via INTRAMUSCULAR

## 2024-02-21 MED ORDER — METHYLPREDNISOLONE ACETATE 40 MG/ML IJ SUSP
40.0000 mg | Freq: Once | INTRAMUSCULAR | Status: AC
  Administered 2024-02-21: 40 mg via INTRAMUSCULAR

## 2024-02-21 NOTE — Telephone Encounter (Signed)
 Monovisc has been ran for bilateral knees on 02/21/24. Waiting approval.  Case #: [id number]

## 2024-02-21 NOTE — Assessment & Plan Note (Signed)
 Lumbar radiculopathy on the right side.  Unfortunately had pain and worsening radicular symptoms on the right side after an epidural so patient would like to hold.  Discussed with patient though that we need to consider the possibility of Cymbalta.  Will hold on the medication at the moment.  Follow-up with me again in 6 to 8 weeks otherwise.

## 2024-02-21 NOTE — Assessment & Plan Note (Signed)
 Discussed with patient about icing regimen, home exercises, discussed with patient that there is some arthritic changes in the.  Has done well with viscosupplementation in 1 to consider this again.  Will try to get approval.  Discussed icing regimen of home exercises, which activities to do and which ones to avoid.  Increase activity slowly otherwise.  Follow-up again in 6 to 8 weeks.

## 2024-02-21 NOTE — Patient Instructions (Addendum)
 Injections in backside Will get gel approval Injected L knee See me again in 2-3 months

## 2024-02-21 NOTE — Assessment & Plan Note (Signed)
 Known arthritis.  Discussed with patient about which activities to do and which ones to avoid.  Discussed stability.  Follow-up again with me 2 months.  Could be a candidate for viscosupplementation as well.

## 2024-02-21 NOTE — Telephone Encounter (Signed)
-----   Message from Aimee Alf sent at 02/21/2024  1:01 PM EDT ----- Regarding: vissco Can you please run for B visco injections?  Thanks

## 2024-02-23 NOTE — Telephone Encounter (Signed)
 Patient is scheduled for 05/15/24   Monovisc authorized for bilateral knee Deductible does not apply OOP MAX $1610 has met $15 Copay $15 Once OOP has been met patient is covered at 100% PA not required  Reference number [id number]

## 2024-02-28 NOTE — Telephone Encounter (Signed)
 noted

## 2024-04-30 DIAGNOSIS — Z1231 Encounter for screening mammogram for malignant neoplasm of breast: Secondary | ICD-10-CM | POA: Diagnosis not present

## 2024-05-10 NOTE — Progress Notes (Signed)
 Darlyn Claudene [facility] [address] [place] [address] Phone: [phone] Subjective:   Bianca Foster am a scribe for Dr. Claudene.   I'm seeing this patient by the request  of:  Pa, [facility]  CC: Low back pain and knee pain  YEP:Dlagzrupcz  02/21/2024 Lumbar radiculopathy on the right side.  Unfortunately had pain and worsening radicular symptoms on the right side after an epidural so patient would like to hold.  Discussed with patient though that we need to consider the possibility of Cymbalta.  Will hold on the medication at the moment.  Follow-up with me again in 6 to 8 weeks otherwise.     Known arthritis.  Discussed with patient about which activities to do and which ones to avoid.  Discussed stability.  Follow-up again with me 2 months.  Could be a candidate for viscosupplementation as well.     Discussed with patient about icing regimen, home exercises, discussed with patient that there is some arthritic changes in the.  Has done well with viscosupplementation in 1 to consider this again.  Will try to get approval.  Discussed icing regimen of home exercises, which activities to do and which ones to avoid.  Increase activity slowly otherwise.  Follow-up again in 6 to 8 weeks.     Updated 05/15/2024 Bianca Foster is a 73 y.o. female coming in with complaint of LBP and B knee pain. Patient states the left one is doing pretty good since the last shot. The right knee and leg are still painful. Cramp in leg and butt.        Past Medical History:  Diagnosis Date   DM (diabetes mellitus) (HCC)    Hyperlipidemia    Hypertension    IBS (irritable bowel syndrome)    Macular degeneration (senile) of retina    Primary open angle glaucoma of both eyes    Past Surgical History:  Procedure Laterality Date   ROTATOR CUFF REPAIR     TUBAL LIGATION     Social History   Socioeconomic History   Marital status: Divorced    Spouse  name: Not on file   Number of children: Not on file   Years of education: Not on file   Highest education level: Not on file  Occupational History   Not on file  Tobacco Use   Smoking status: Former    Current packs/day: 0.00    Types: Cigarettes    Quit date: 05/20/2009    Years since quitting: 14.9   Smokeless tobacco: Never  Substance and Sexual Activity   Alcohol use: Yes    Alcohol/week: 1.0 standard drink of alcohol    Types: 1 Glasses of wine per week   Drug use: No   Sexual activity: Not on file  Other Topics Concern   Not on file  Social History Narrative   Not on file   Social Drivers of Health   Financial Resource Strain: Not on file  Food Insecurity: Not on file  Transportation Needs: Not on file  Physical Activity: Not on file  Stress: Not on file  Social Connections: Not on file   Allergies  Allergen Reactions   Codeine Nausea And Vomiting   Cortisone     Swollen foot   Prednisone     Swollen shut eyes   Family History  Problem Relation Age of Onset   Alzheimer's disease Mother    Heart disease Father  valve replacement   Hypertension Sister    Hyperlipidemia Sister    Dementia Sister      Current Outpatient Medications (Cardiovascular):    amLODipine  (NORVASC ) 10 MG tablet, Take 1 tablet (10 mg total) by mouth daily.  Current Outpatient Medications (Respiratory):    cetirizine (ZYRTEC) 10 MG tablet, Take 10 mg by mouth daily as needed for allergies.    Current Outpatient Medications (Other):    ALPRAZolam (NIRAVAM) 0.5 MG dissolvable tablet, Take 0.5 mg by mouth at bedtime as needed for anxiety.   Bilberry, Vaccinium myrtillus, (BILBERRY PO), Take by mouth.   Biotin 2.5 MG TABS, Take by mouth.   gabapentin  (NEURONTIN ) 100 MG capsule, Take 100 mg by mouth at bedtime as needed.   Multiple Vitamin (MULTIVITAMIN) tablet, Take 1 tablet by mouth daily.   tiZANidine  (ZANAFLEX ) 2 MG tablet, Take 1 tablet (2 mg total) by mouth at  bedtime.   Reviewed prior external information including notes and imaging from  primary care provider As well as notes that were available from care everywhere and other healthcare systems.  Past medical history, social, surgical and family history all reviewed in electronic medical record.  No pertanent information unless stated regarding to the chief complaint.   Review of Systems:  No headache, visual changes, nausea, vomiting, diarrhea, constipation, dizziness, abdominal pain, skin rash, fevers, chills, night sweats, weight loss, swollen lymph nodes, body aches, joint swelling, chest pain, shortness of breath, mood changes. POSITIVE muscle aches  Objective  Blood pressure 124/70, pulse 74, height 5' 2 (1.575 m), weight 150 lb 12.8 oz (68.4 kg), SpO2 99%.   General: No apparent distress alert and oriented x3 mood and affect normal, dressed appropriately.  HEENT: Pupils equal, extraocular movements intact  Respiratory: Patient's speak in full sentences and does not appear short of breath  Cardiovascular: No lower extremity edema, non tender, no erythema  Low back exam shows mild loss lordosis.  Still tightness noted in the [place] on the right side.  Left knee exam shows Arthritic changes noted.  Seems to be actually bilaterally.  Seems to be more in the patellofemoral area bilaterally.  After informed written and verbal consent, patient was seated on exam table. Right knee was prepped with alcohol swab and utilizing anterolateral approach, patient's right knee space was injected with 48 mg per 3 mL of Monovisc (sodium hyaluronate) in a prefilled syringe was injected easily into the knee through a 22-gauge needle..Patient tolerated the procedure well without immediate complications.  After informed written and verbal consent, patient was seated on exam table. Left knee was prepped with alcohol swab and utilizing anterolateral approach, patient's left knee space was injected with 40 mg per  3 mL of Monovisc (sodium hyaluronate) in a prefilled syringe was injected easily into the knee through a 22-gauge needle..Patient tolerated the procedure well without immediate complications.   Impression and Recommendations:     The above documentation has been reviewed and is accurate and complete Creston Klas M Amaree Leeper, DO

## 2024-05-15 ENCOUNTER — Ambulatory Visit: Admitting: Family Medicine

## 2024-05-15 ENCOUNTER — Encounter: Payer: Self-pay | Admitting: Family Medicine

## 2024-05-15 VITALS — BP 124/70 | HR 74 | Ht 62.0 in | Wt 150.8 lb

## 2024-05-15 DIAGNOSIS — M1712 Unilateral primary osteoarthritis, left knee: Secondary | ICD-10-CM | POA: Diagnosis not present

## 2024-05-15 DIAGNOSIS — M170 Bilateral primary osteoarthritis of knee: Secondary | ICD-10-CM | POA: Diagnosis not present

## 2024-05-15 DIAGNOSIS — M1711 Unilateral primary osteoarthritis, right knee: Secondary | ICD-10-CM

## 2024-05-15 MED ORDER — HYALURONAN 88 MG/4ML IX SOSY
88.0000 mg | PREFILLED_SYRINGE | Freq: Once | INTRA_ARTICULAR | Status: AC
  Administered 2024-05-15: 88 mg via INTRA_ARTICULAR

## 2024-05-15 NOTE — Assessment & Plan Note (Signed)
 Patient given injection and tolerated the procedure well, discussed icing regimen and home exercises, discussed which activities to do and which ones to avoid.  Increase activity slowly.  Discussed icing regimen.  PT

## 2024-05-15 NOTE — Assessment & Plan Note (Signed)
 Chronic problem that has responded well to viscosupplementation in the past and hopeful that we will have a again.  Discussed icing regimen and home exercises, discussed which activities to do manage musculature.  Increase activity slowly.  Follow-up again in 6 to 8 weeks

## 2024-05-15 NOTE — Patient Instructions (Signed)
 Good to see you! Look up [facility]. See me again in 2 to 3 months.

## 2024-06-05 DIAGNOSIS — Z20822 Contact with and (suspected) exposure to covid-19: Secondary | ICD-10-CM | POA: Diagnosis not present

## 2024-06-05 DIAGNOSIS — I10 Essential (primary) hypertension: Secondary | ICD-10-CM | POA: Diagnosis not present

## 2024-06-05 DIAGNOSIS — R7303 Prediabetes: Secondary | ICD-10-CM | POA: Diagnosis not present

## 2024-06-05 DIAGNOSIS — E2839 Other primary ovarian failure: Secondary | ICD-10-CM | POA: Diagnosis not present

## 2024-06-05 DIAGNOSIS — Z0000 Encounter for general adult medical examination without abnormal findings: Secondary | ICD-10-CM | POA: Diagnosis not present

## 2024-06-05 DIAGNOSIS — E785 Hyperlipidemia, unspecified: Secondary | ICD-10-CM | POA: Diagnosis not present

## 2024-06-05 DIAGNOSIS — F419 Anxiety disorder, unspecified: Secondary | ICD-10-CM | POA: Diagnosis not present

## 2024-06-05 DIAGNOSIS — Z1331 Encounter for screening for depression: Secondary | ICD-10-CM | POA: Diagnosis not present

## 2024-06-05 DIAGNOSIS — L659 Nonscarring hair loss, unspecified: Secondary | ICD-10-CM | POA: Diagnosis not present

## 2024-07-17 ENCOUNTER — Ambulatory Visit: Admitting: Family Medicine

## 2024-09-18 NOTE — Progress Notes (Signed)
 " Bianca Foster [address] [place] [address] Phone: [phone] Subjective:   ISusannah Foster, am serving as a scribe for Dr. [name] Claudene.  I'm seeing this patient by the request  of:  Pa, [facility]  CC: Bilateral knee and leg pain  YEP:Dlagzrupcz  05/15/2024 Chronic problem that has responded well to viscosupplementation in the past and hopeful that we will have a again.  Discussed icing regimen and home exercises, discussed which activities to do manage musculature.  Increase activity slowly.  Follow-up again in 6 to 8 weeks     Patient given injection and tolerated the procedure well, discussed icing regimen and home exercises, discussed which activities to do and which ones to avoid.  Increase activity slowly.  Discussed icing regimen.  PT     Updated 09/25/24 Bianca Foster is a 73 y.o. female coming in with complaint of B knee pain, patient with known arthritis, did have viscosupplementation in August.  Patient states knees are doing fine. Lower leg acting a fool. R foot having some issues on the lateral side. Feels like it doesn't want to bend.       Past Medical History:  Diagnosis Date   DM (diabetes mellitus) (HCC)    Hyperlipidemia    Hypertension    IBS (irritable bowel syndrome)    Macular degeneration (senile) of retina    Primary open angle glaucoma of both eyes    Past Surgical History:  Procedure Laterality Date   ROTATOR CUFF REPAIR     TUBAL LIGATION     Social History   Socioeconomic History   Marital status: Divorced    Spouse name: Not on file   Number of children: Not on file   Years of education: Not on file   Highest education level: Not on file  Occupational History   Not on file  Tobacco Use   Smoking status: Former    Current packs/day: 0.00    Types: Cigarettes    Quit date: 05/20/2009    Years since quitting: 15.3   Smokeless tobacco: Never  Substance and Sexual Activity    Alcohol use: Yes    Alcohol/week: 1.0 standard drink of alcohol    Types: 1 Glasses of wine per week   Drug use: No   Sexual activity: Not on file  Other Topics Concern   Not on file  Social History Narrative   Not on file   Social Drivers of Health   Tobacco Use: Medium Risk (05/15/2024)   Patient History    Smoking Tobacco Use: Former    Smokeless Tobacco Use: Never    Passive Exposure: Not on Actuary Strain: Not on file  Food Insecurity: Not on file  Transportation Needs: Not on file  Physical Activity: Not on file  Stress: Not on file  Social Connections: Not on file  Depression (EYV7-0): Not on file  Alcohol Screen: Not on file  Housing: Not on file  Utilities: Not on file  Health Literacy: Not on file   Allergies[1] Family History  Problem Relation Age of Onset   Alzheimer's disease Mother    Heart disease Father        valve replacement   Hypertension Sister    Hyperlipidemia Sister    Dementia Sister     Current Outpatient Medications (Cardiovascular):    amLODipine  (NORVASC ) 10 MG tablet, Take 1 tablet (10 mg total) by mouth daily.  Current Outpatient  Medications (Respiratory):    cetirizine (ZYRTEC) 10 MG tablet, Take 10 mg by mouth daily as needed for allergies.  Current Outpatient Medications (Other):    ALPRAZolam (NIRAVAM) 0.5 MG dissolvable tablet, Take 0.5 mg by mouth at bedtime as needed for anxiety.   Bilberry, Vaccinium myrtillus, (BILBERRY PO), Take by mouth.   Biotin 2.5 MG TABS, Take by mouth.   gabapentin  (NEURONTIN ) 100 MG capsule, Take 100 mg by mouth at bedtime as needed.   Multiple Vitamin (MULTIVITAMIN) tablet, Take 1 tablet by mouth daily.   tiZANidine  (ZANAFLEX ) 2 MG tablet, Take 1 tablet (2 mg total) by mouth at bedtime.   Reviewed prior external information including notes and imaging from  primary care provider As well as notes that were available from care everywhere and other healthcare systems.  Past  medical history, social, surgical and family history all reviewed in electronic medical record.  No pertanent information unless stated regarding to the chief complaint.   Review of Systems:  No headache, visual changes, nausea, vomiting, diarrhea, constipation, dizziness, abdominal pain, skin rash, fevers, chills, night sweats, weight loss, swollen lymph nodes, body aches, joint swelling, chest pain, shortness of breath, mood changes. POSITIVE muscle aches  Objective  Blood pressure 124/80, pulse 76, height 5' 2 (1.575 m), weight 154 lb (69.9 kg), SpO2 98%.   General: No apparent distress alert and oriented x3 mood and affect normal, dressed appropriately.  HEENT: Pupils equal, extraocular movements intact  Respiratory: Patient's speak in full sentences and does not appear short of breath  Cardiovascular: No lower extremity edema, non tender, no erythema  , Bilateral knee exam shows the patient does still have some arthritic changes noted.  Trace effusion of the knees noted bilaterally. Right ankle shows that there is some supination noted at baseline.  Tenderness to palpation noted.  Tightness noted in the peroneal tendon.  No pain over the plantar fascia itself at this time.  [id number]; 15 additional minutes spent for Therapeutic exercises as stated in above notes.  This included exercises focusing on stretching, strengthening, with significant focus on eccentric aspects.   Long term goals include an improvement in range of motion, strength, endurance as well as avoiding reinjury. Patient's frequency would include in 1-2 times a day, 3-5 times a week for a duration of 6-12 weeks. Ankle strengthening that included:  Basic range of motion exercises to allow proper full motion at ankle Stretching of the lower leg and hamstrings  Theraband exercises for the lower leg - inversion, eversion, dorsiflexion and plantarflexion each to be completed with a theraband Balance exercises to increase  proprioception Weight bearing exercises to increase strength and balance  Proper technique shown and discussed handout in great detail with ATC.  All questions were discussed and answered.     Impression and Recommendations:     The above documentation has been reviewed and is accurate and complete [name] CHRISTELLA Sharps, DO       [1]  Allergies Allergen Reactions   Codeine Nausea And Vomiting   Cortisone     Swollen foot   Prednisone     Swollen shut eyes   "

## 2024-09-25 ENCOUNTER — Ambulatory Visit: Admitting: Family Medicine

## 2024-09-25 VITALS — BP 124/80 | HR 76 | Ht 62.0 in | Wt 154.0 lb

## 2024-09-25 DIAGNOSIS — M25552 Pain in left hip: Secondary | ICD-10-CM | POA: Diagnosis not present

## 2024-09-25 DIAGNOSIS — M79671 Pain in right foot: Secondary | ICD-10-CM | POA: Diagnosis not present

## 2024-09-25 DIAGNOSIS — M25512 Pain in left shoulder: Secondary | ICD-10-CM | POA: Diagnosis not present

## 2024-09-25 DIAGNOSIS — M1711 Unilateral primary osteoarthritis, right knee: Secondary | ICD-10-CM | POA: Diagnosis not present

## 2024-09-25 DIAGNOSIS — G8929 Other chronic pain: Secondary | ICD-10-CM

## 2024-09-25 NOTE — Patient Instructions (Addendum)
 Do prescribed exercises at least 3x a week Recovery sandals in the house Lateral heel wedge See you again in 2 months

## 2024-09-25 NOTE — Assessment & Plan Note (Signed)
 Question if it is more secondary to a plantar nerve entrapment versus peroneal tendon, given exercises, heel lift, discussed recovery sandals, follow-up with me again in 2 months.  Worsening pain consider custom orthotic next time.  Follow-up with me again in 2 months

## 2024-09-25 NOTE — Assessment & Plan Note (Signed)
 Knees seem to be doing well.  Will hold on any other injection at this time.

## 2024-11-26 ENCOUNTER — Ambulatory Visit: Admitting: Family Medicine
